# Patient Record
Sex: Male | Born: 1990 | Race: Black or African American | Hispanic: No | Marital: Single | Smoking: Current some day smoker
Health system: Southern US, Community
[De-identification: ages and names within clinical notes are randomized; demographics above are authoritative.]

## PROBLEM LIST (undated history)

## (undated) ENCOUNTER — Emergency Department (HOSPITAL_COMMUNITY): Admission: EM | Payer: Self-pay | Source: Home / Self Care

## (undated) ENCOUNTER — Ambulatory Visit (HOSPITAL_COMMUNITY): Admission: EM | Payer: Self-pay | Source: Home / Self Care

## (undated) ENCOUNTER — Emergency Department (HOSPITAL_COMMUNITY): Admission: EM | Discharge status: Absent without leave | Payer: Self-pay

## (undated) ENCOUNTER — Ambulatory Visit (HOSPITAL_COMMUNITY): Payer: Self-pay

## (undated) DIAGNOSIS — J45909 Unspecified asthma, uncomplicated: Secondary | ICD-10-CM

## (undated) DIAGNOSIS — W3400XA Accidental discharge from unspecified firearms or gun, initial encounter: Secondary | ICD-10-CM

## (undated) DIAGNOSIS — F259 Schizoaffective disorder, unspecified: Secondary | ICD-10-CM

---
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
## 2010-01-22 ENCOUNTER — Ambulatory Visit: Payer: Self-pay | Admitting: Family Medicine

## 2010-01-22 ENCOUNTER — Encounter: Payer: Self-pay | Admitting: Family Medicine

## 2010-01-22 DIAGNOSIS — N6459 Other signs and symptoms in breast: Secondary | ICD-10-CM

## 2010-01-22 DIAGNOSIS — F319 Bipolar disorder, unspecified: Secondary | ICD-10-CM | POA: Insufficient documentation

## 2010-01-22 LAB — CONVERTED CEMR LAB
AST: 22 units/L (ref 0–37)
Alkaline Phosphatase: 95 units/L (ref 39–117)
BUN: 11 mg/dL (ref 6–23)
Creatinine, Ser: 0.91 mg/dL (ref 0.40–1.50)
HCT: 38.8 % — ABNORMAL LOW (ref 39.0–52.0)
Hemoglobin: 12.6 g/dL — ABNORMAL LOW (ref 13.0–17.0)
MCHC: 32.5 g/dL (ref 30.0–36.0)
MCV: 68.4 fL — ABNORMAL LOW (ref 78.0–100.0)
RDW: 16 % — ABNORMAL HIGH (ref 11.5–15.5)

## 2010-02-02 ENCOUNTER — Telehealth: Payer: Self-pay | Admitting: *Deleted

## 2010-02-02 ENCOUNTER — Encounter: Payer: Self-pay | Admitting: *Deleted

## 2010-02-03 ENCOUNTER — Encounter (INDEPENDENT_AMBULATORY_CARE_PROVIDER_SITE_OTHER): Payer: Self-pay | Admitting: *Deleted

## 2010-03-31 ENCOUNTER — Encounter: Payer: Self-pay | Admitting: Family Medicine

## 2010-03-31 ENCOUNTER — Ambulatory Visit: Admission: RE | Admit: 2010-03-31 | Discharge: 2010-03-31 | Payer: Self-pay | Source: Home / Self Care

## 2010-03-31 DIAGNOSIS — L738 Other specified follicular disorders: Secondary | ICD-10-CM | POA: Insufficient documentation

## 2010-03-31 LAB — CONVERTED CEMR LAB

## 2010-04-01 ENCOUNTER — Encounter: Payer: Self-pay | Admitting: Family Medicine

## 2010-04-09 NOTE — Letter (Signed)
Summary: Probation Letter  [facility]  [address]   [place], [place] [address]   Phone: [phone]  Fax: [phone]    06/04/2009  Adrian Mcgrath [address] [place], [place]  [address]  Dear [name],  With the goal of better serving all our patients the [facility] is following each patient's missed appointments.  You have missed at least 3 appointments with our practice.If you cannot keep your appointment, we expect you to call at least 24 hours before your appointment time.  Missing appointments prevents other patients from seeing [place] and makes it difficult to provide you with the best possible medical care.      1.   If you miss one more appointment, we will only give you limited medical services. This means we will not call in medication refills, complete a form, or make a referral for you except when you are here for a scheduled office visit.    2.   If you miss 2 or more appointments in the next year, we will dismiss you from our practice.    Our office staff can be reached at [phone] Monday through Friday from 8:30 a.m.-5:00 p.m. and will be glad to schedule your appointment as necessary.    Thank you.   [facility]  Appended Document: Probation Letter mail returned

## 2010-04-09 NOTE — Miscellaneous (Signed)
Summary: Asthma, persistent    Clinical Lists Changes  Problems: Removed problem of CONTACT WITH OR EXPOSURE TO VENEREAL DISEASES (ICD-V01.6) Changed problem from ASTHMA, UNSPECIFIED (ICD-493.90) to ASTHMA, PERSISTENT (ICD-493.90)

## 2010-04-09 NOTE — Progress Notes (Signed)
Summary: meds prob   Phone Note Call from Patient Call back at [phone]   Caller: Mom-Penny Summary of Call: went to [facility] Saturday for pink eye and has lost RX drops needs more called in.  Initial call taken by: De Nurse,  October 01, 2009 10:48 AM  Follow-up for Phone Call        told her we cannot refill this since none of our mds wrote it. asked her to call her pharmacy where they got it filled & ask them to contact the prescibing md. if this does not waork, call back for an appt here Follow-up by: Golden Circle RN,  October 01, 2009 10:54 AM

## 2010-04-09 NOTE — Assessment & Plan Note (Signed)
Summary: asthma/std testing/bmc   Vital Signs:  Patient profile:   20 year old male Height:      67 inches Weight:      133.2 pounds BMI:     20.94 Temp:     98.2 degrees F oral Pulse rate:   86 / minute BP sitting:   126 / 78  (left arm) Cuff size:   regular  Vitals Entered By: Jimmy Footman, CMA (March 31, 2010 8:42 AM) CC: STD test/ cpe Is Patient Diabetic? No Pain Assessment Patient in pain? no        Primary Care Provider:  Delbert Harness MD  CC:  STD test/ cpe.  History of Present Illness: 19 yo here for follow-up  Psych:  seeing [facility] for mental health.  Decerased dose of depakote, still on cgentin and haldol.   Wearing ankle monitor.  STD:  states not sexually active, using condoms when he was.  no penile discharge or dysuria.  Notes 1-2 days ago "bumps" that appeared right after he shaved.  First occurrence, mildly irritated.  Desires STD screening.  Asthma:  usuing qvar and albuterol daily- he was unclear about when to use albuterol.  NO dyspnea, wheeze, allergies, cough.    Habits & Providers  Alcohol-Tobacco-Diet     Tobacco Status: current  Current Medications (verified): 1)  Qvar 80 Mcg/act Aers (Beclomethasone Dipropionate) .... 2 Puffs Twice A Day- Use in Place of Advair 2)  Zyrtec Allergy 10 Mg  Tabs (Cetirizine Hcl) .Marland Kitchen.. 1 Once Daily Prn 3)  Ventolin Hfa 108 (90 Base) Mcg/act Aers (Albuterol Sulfate) .... As Needed Shortness of Breath 4)  Depakote 250 Mg Tbec (Divalproex Sodium) .... One Tablet Twice A Day 5)  Benztropine Mesylate 2 Mg Tabs (Benztropine Mesylate) .... Take One Tablet Twice Daily 6)  Haloperidol 10 Mg Tabs (Haloperidol) .... One Tablet Twice A Day PMH-FH-SH reviewed for relevance  Review of Systems General:  Denies chills, fever, loss of appetite, sleep disorder, and weight loss. Eyes:  Denies blurring. ENT:  Denies earache and sore throat. CV:  Denies chest pain or discomfort, fatigue, lightheadness, palpitations, and  swelling of feet. Resp:  Denies chest discomfort, cough, shortness of breath, and wheezing. GI:  Denies abdominal pain, constipation, diarrhea, nausea, and vomiting. GU:  Denies dysuria, genital sores, and hematuria. MS:  Denies joint pain and joint swelling. Psych:  Denies anxiety, depression, easily angered, and irritability. Endo:  Denies cold intolerance and excessive thirst. Allergy:  Denies itching eyes.  Physical Exam  General:  Well appearing adolescent,no acute distress.  Thin adolescent with multiple tattoos across arms, neck and back. Eyes:  No corneal or conjunctival inflammation noted. EOMI. Perrla. Ears:  External ear exam shows no significant lesions or deformities.  Otoscopic examination reveals clear canals, tympanic membranes are intact bilaterally without bulging, retraction, inflammation or discharge. Hearing is grossly normal bilaterally. Nose:  External nasal examination shows no deformity or inflammation. Nasal mucosa are pink and moist without lesions or exudates. Mouth:  Oral mucosa and oropharynx without lesions or exudates.  Teeth in good repair. Neck:  supple, no masses Lungs:  Normal respiratory effort, chest expands symmetrically. Lungs are clear to auscultation, no crackles or wheezes. Heart:  Normal rate and regular rhythm. S1 and S2 normal without gallop, murmur, click, rub or other extra sounds. Genitalia:  circumcised.  small non erythematous papule over  hair follicle located on right around base of penis.   3 small similar papules located on base of  penis in area patient states he shaved.  no lymphadenopathy in groin.   Impression & Recommendations:  Problem # 1:  FOLLICULITIS (ICD-704.8)  Will check GC/Chalmydia, HIV, RPR today.  Papule on base consistant with follicultis.  Other area of papules appears similar and does not appear consisitant with HSV, chancroid, LGV.  no ulcer or vesicle to take sample.  Advised to discontinue shaving and if recurs,  come back for further evaluation.  Orders: [facility]- Est  Level 4 (98119)  Problem # 2:  ASTHMA, PERSISTENT (ICD-493.90)  Reveiwed proper use of controller vs emergency inhaler.  His updated medication list for this problem includes:    Qvar 80 Mcg/act Aers (Beclomethasone dipropionate) .Marland Kitchen... 2 puffs twice a day- use in place of advair    Ventolin Hfa 108 (90 Base) Mcg/act Aers (Albuterol sulfate) .Marland Kitchen... As needed shortness of breath  Orders: [facility]- Est  Level 4 (99214)  Problem # 3:  BIPOLAR DISORDER UNSPECIFIED (ICD-296.80) Now under care of [facility].  Med Rec completed.    Problem # 4:  CONTACT WITH OR EXPOSURE TO VENEREAL DISEASES (ICD-V01.6) See problem #1  Orders: GC/Chlamydia-[facility] (87591/87491) HIV-[facility] (14782-95621) RPR-[facility] (30865-78469) [facility]- Est  Level 4 (62952)  Complete Medication List: 1)  Qvar 80 Mcg/act Aers (Beclomethasone dipropionate) .... 2 puffs twice a day- use in place of advair 2)  Zyrtec Allergy 10 Mg Tabs (Cetirizine hcl) .Marland Kitchen.. 1 once daily prn 3)  Ventolin Hfa 108 (90 Base) Mcg/act Aers (Albuterol sulfate) .... As needed shortness of breath 4)  Depakote 250 Mg Tbec (Divalproex sodium) .... One tablet twice a day 5)  Benztropine Mesylate 2 Mg Tabs (Benztropine mesylate) .... Take one tablet twice daily 6)  Haloperidol 10 Mg Tabs (Haloperidol) .... One tablet twice a day  Patient Instructions: 1)  Keep looking for a job and stay in school!  Im glad you're doing better 2)  Only use your ventolin when you feel short of breath.  If you have to use it regularly, please make appointment to see me. 3)  Follow-up in 1 year or sooner if needed. 4)  I will send you a letter if you test results are normal.   Orders Added: 1)  GC/Chlamydia-[facility] [87591/87491] 2)  HIV-[facility] [84132-44010] 3)  RPR-[facility] [27253-66440] 4)  [facility]- Est  Level 4 [34742]

## 2010-04-09 NOTE — Progress Notes (Signed)
   Phone Note Outgoing Call   Call placed by: Jimmy Footman, CMA,  February 02, 2010 10:12 AM Call placed to: [facility] Summary of Call: Spoke with [place] @ the [facility] to ask about faxing a referral over to them for this pt. She told me that they do not accept the referrals and that the pt. is to make his own appt. Tried to contact pt. but phone # was temp. out of service. A letter was sent to the pt. and also for the pt. to call office when appt. is made

## 2010-04-09 NOTE — Miscellaneous (Signed)
Summary: fell off skateboard   Clinical Lists Changes fell off skateboard & hurt his wrist & side. states it is bleeding, not spurting. she is taking him to ED. told her I agreed.Golden Circle RN  September 03, 2009 11:27 AM

## 2010-04-09 NOTE — Assessment & Plan Note (Signed)
Summary: asthma, psych meds   Vital Signs:  Patient profile:   20 year old male Height:      67 inches Weight:      139.6 pounds BMI:     21.94 Temp:     98.1 degrees F oral Pulse rate:   80 / minute BP sitting:   102 / 64  (left arm) Cuff size:   regular  Vitals Entered By: Garen Grams LPN (January 22, 2010 11:05 AM) CC: knot on chest and refill meds Is Patient Diabetic? No Pain Assessment Patient in pain? no        Primary Care Provider:  Delbert Harness MD  CC:  knot on chest and refill meds.  History of Present Illness: 20 yo here to discuss refill meds and left breast lump  breast lump:  noticed lump behind left nipple for 2 weeks.  Slightly tender.   no skin changes or nipple discharge.    refill meds:  was incarcerated and was sent to [facility] inpatient psych for evaluation.  He brings depakote, haldol, and cogentin for refill.    asthma:  has been out of all meds.  has nocturnal cough, nonproductive nearly nightly.  Denies dyspnea.  has had some rhinorrhea but no fever, chills, sore throat, n/v/d  Habits & Providers  Alcohol-Tobacco-Diet     Tobacco Status: current     Tobacco Counseling: to quit use of tobacco products     Cigarette Packs/Day: <0.25  Current Medications (verified): 1)  Advair Diskus 100-50 Mcg/dose Misc (Fluticasone-Salmeterol) .Marland Kitchen.. 1 Puff 2 Times Daily 2)  Zyrtec Allergy 10 Mg  Tabs (Cetirizine Hcl) .Marland Kitchen.. 1 Once Daily Prn 3)  Ventolin Hfa 108 (90 Base) Mcg/act Aers (Albuterol Sulfate) .... As Needed Shortness of Breath 4)  Depakote 500 Mg Tbec (Divalproex Sodium) .... One Tablet Twice Daily 5)  Benztropine Mesylate 2 Mg Tabs (Benztropine Mesylate) .... Take One Tablet Twice Daily 6)  Haldol Decanoate 100 Mg/ml Soln (Haloperidol Decanoate) .... 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  Asthma Seasonal Allergies.  Past Surgical History: none  Social History: Lives with Mom and 5 siblings.  repeated legal troubles- most recent incarceration 12/2009 with subsequent inpatient psych eval.  Siblings Lula Olszewski, Heavan Denham, Tyrel Denham, Micah Flesher WilliamsonSmoking Status:  current Packs/Day:  <0.25  Review of Systems      See HPI  Physical Exam  General:  Well appearing adolescent,no acute distress.  Thin adolescent with multiple tattoos across arms and back. Breasts:  left breast with small breast bud.  Right wnl Lungs:  scattered expiratory wheezes throughout.  Good air movement,  normal work of breathing. Heart:  RRR without murmur    Impression & Recommendations:  Problem # 1:  ASTHMA, PERSISTENT (ICD-493.90)  Some wheezes today.  Advised resume use of advair.  given red flag for return.  His updated medication list for this problem includes:    Advair Diskus 100-50 Mcg/dose Misc (Fluticasone-salmeterol) .Marland Kitchen... 1 puff 2 times daily    Ventolin Hfa 108 (90 Base) Mcg/act Aers (Albuterol sulfate) .Marland Kitchen... As needed shortness of breath  Orders: [facility]- Est  Level 4 (04540)  Problem # 2:  BIPOLAR DISORDER UNSPECIFIED (ICD-296.80) per patient he has diagnosis of bipolar disorder.  Agreed to refill meds for one month.  Will request medical records from inpatient hospitalization for  further details.  Will refer to mental health for psychiatry care.  Monitoring labs as below.  Orders: Comp Met-[facility] [phone]) CBC-[facility] (09811) TSH-[facility] [phone]) Valproic Acid-[facility] (13086-57846) [facility]- Est  Level 4 (96295)  Problem # 3:  OTHER SIGN AND SYMPTOM IN BREAST (ICD-611.79)  normal pubertal variant.  Advised monitoring at this time.  Orders: [facility]- Est  Level 4 (99214)  Complete Medication List: 1)  Advair Diskus 100-50 Mcg/dose Misc (Fluticasone-salmeterol) .Marland Kitchen.. 1 puff 2 times daily 2)  Zyrtec Allergy 10 Mg  Tabs (Cetirizine hcl) .Marland Kitchen.. 1 once daily prn 3)  Ventolin Hfa 108 (90 Base) Mcg/act Aers (Albuterol sulfate) .... As needed shortness of breath 4)  Depakote 500 Mg Tbec (Divalproex sodium) .... One tablet twice daily 5)  Benztropine Mesylate 2 Mg Tabs (Benztropine mesylate) .... Take one tablet twice daily 6)  Haloperidol 10 Mg Tabs (Haloperidol) .... One tablet twice a day  Patient Instructions: 1)  I think your knot under your left breast is normal and will go away in time. if it gets larger, p[lease return for re-eval.  2)  Will chekc labwork today 3)  will get records from [place] 4)  Wil refer you to a psychiatrist- if you do not hear about an appt from [place] in 1-2 weeks, give [place] a call Prescriptions: HALOPERIDOL 10 MG TABS (HALOPERIDOL) one tablet twice a day  #60 x 0   Entered and Authorized by:   Delbert Harness MD   Signed by:   Delbert Harness MD on 01/22/2010   Method used:   [facility] to        [facility] [phone]* (retail)       [address] [place]  [address]       Ph: [phone] or [phone]       Fax: [phone]   RxID:   [id number] VENTOLIN HFA 108 (90 BASE) MCG/ACT AERS (ALBUTEROL SULFATE) as needed shortness of breath  #1 x 2   Entered and Authorized by:   Delbert Harness MD   Signed by:   Delbert Harness MD on 01/22/2010   Method used:   [facility] to        [facility] [phone]* (retail)       [address], [place]  [address]       Ph: [phone] or [phone]       Fax: [phone]   RxID:   [id number] ZYRTEC ALLERGY 10 MG  TABS (CETIRIZINE HCL) 1 once daily prn  #30 x 5   Entered and Authorized by:   Delbert Harness MD   Signed by:   Delbert Harness MD on 01/22/2010   Method used:   [facility]ronically to        [facility] [phone]* (retail)       [address] [place]  [address]       Ph: [phone] or [phone]       Fax: [phone]   RxID:   [id number] ADVAIR DISKUS 100-50 MCG/DOSE MISC  (FLUTICASONE-SALMETEROL) 1 puff 2 times daily  #1 x 5   Entered and Authorized by:   Delbert Harness MD   Signed by:   Delbert Harness MD on 01/22/2010   Method used:   [facility]ronically to        [facility] [phone]* (retail)  [address], [place]  [address]       Ph: [phone] or [phone]       Fax: [phone]   RxID:   [id number] HALDOL DECANOATE 100 MG/ML SOLN (HALOPERIDOL DECANOATE) one tabet twice a day  #60 x 0   Entered and Authorized by:   Delbert Harness MD   Signed by:   Delbert Harness MD on 01/22/2010   Method used:   [facility]ronically to        [facility] [phone]* (retail)       [address], [place]  [address]       Ph: [phone] or [phone]       Fax: [phone]   RxID:   [id number] BENZTROPINE MESYLATE 2 MG TABS (BENZTROPINE MESYLATE) take one tablet twice daily  #60 x 0   Entered and Authorized by:   Delbert Harness MD   Signed by:   Delbert Harness MD on 01/22/2010   Method used:   [facility]ronically to        [facility] [phone]* (retail)       [address], [place]  [address]       Ph: [phone] or [phone]       Fax: [phone]   RxID:   [id number] DEPAKOTE 500 MG TBEC (DIVALPROEX SODIUM) one tablet twice daily  #60 x 0   Entered and Authorized by:   Delbert Harness MD   Signed by:   Delbert Harness MD on 01/22/2010   Method used:   [facility]ronically to        [facility] [phone]* (retail)       [address], [place]  [address]       Ph: [phone] or [phone]       Fax: [phone]   RxID:   [id number]    Orders Added: 1)  Comp Met-[facility] [[id number]] 2)  CBC-[facility] [85027] 3)  TSH-[facility] [02409-73532] 4)  Valproic Acid-[facility] [80164-23520] 5)  [facility]- Est  Level 4 [99242]  Appended Document: psych referral     Clinical Lists Changes  Orders: Added new Referral order of Psychiatric Referral (Psych) - Signed      Appended Document: asthma, psych meds Medications  Added QVAR 80 MCG/ACT AERS (BECLOMETHASONE DIPROPIONATE) 2 puffs twice a day- Use in place of advair      Receievd prior authorization for advair.  Since patient has been completely of controller medications, will start back with flovent and escalate back to advair if needed. Delbert Harness MD  January 23, 2010 8:36 AM     Clinical Lists Changes  Medications: Changed medication from ADVAIR DISKUS 100-50 MCG/DOSE MISC (FLUTICASONE-SALMETEROL) 1 puff 2 times daily to QVAR 80 MCG/ACT AERS (BECLOMETHASONE DIPROPIONATE) 2 puffs twice a day- Use in place of advair - Signed Rx of QVAR 80 MCG/ACT AERS (BECLOMETHASONE DIPROPIONATE) 2 puffs twice a day- Use in place of advair;  #1 x 2;  Signed;  Entered by: Delbert Harness MD;  Authorized by: Delbert Harness MD;  Method used: [facility] [phone]*, [address], [place], [place]  [address], Ph: [phone] or [phone], Fax: [phone]    Prescriptions: QVAR 80 MCG/ACT AERS (BECLOMETHASONE DIPROPIONATE) 2 puffs twice a day- Use in place of advair  #1 x 2  Entered and Authorized by:   Delbert Harness MD   Signed by:   Delbert Harness MD on 01/23/2010   Method used:   [facility]ronically to        [facility] [phone]* (retail)       [address]       [place], [place]  [address]       Ph: [phone] or [phone]       Fax: [phone]   RxID:   [id number]

## 2010-04-09 NOTE — Letter (Signed)
Summary: ROI  ROI   Imported By: Marily Memos 02/05/2010 15:10:01  _____________________________________________________________________  External Attachment:    Type:   Image     Comment:   External Document

## 2010-04-09 NOTE — Miscellaneous (Signed)
Summary: Medical record request  Clinical Lists Changes  Rec'd medical record request to go to: [place] [facility] Date sent: 12/29/09 Marily Memos  February 03, 2010 12:16 PM

## 2010-04-09 NOTE — Letter (Signed)
Summary: Generic Letter  [facility]  [address]   [place], [place] [address]   Phone: [phone]  Fax: [phone]    02/02/2010  [facility] [address] [place], [place]  [address]  Dear Mr. WEXLER,   A psychological referral was put in for you from Dr. Earnest Bailey. The appointment is needing to be set up by you throught the [facility]. The number to call is [phone]. Please follow up with [place] when the appointment is made.      Sincerely,   Jimmy Footman, CMA

## 2010-04-09 NOTE — Letter (Signed)
Summary: Results Follow-up Letter  [facility]  [address]   [place], [place] [address]   Phone: [phone]  Fax: [phone]    04/01/2010  [address] [place], [place]  [address]  Dear Mr. NYLUND,   The following are the results of your recent test(s):  Your tests for gonorrhea, chlamydia, HIV, syphillis were all negative.    Sincerely,  Delbert Harness MD [facility]           Appended Document: Results Follow-up Letter mailed

## 2010-06-25 ENCOUNTER — Other Ambulatory Visit: Payer: Self-pay | Admitting: Family Medicine

## 2010-06-25 NOTE — Telephone Encounter (Signed)
Refill request

## 2010-07-02 ENCOUNTER — Telehealth: Payer: Self-pay | Admitting: *Deleted

## 2010-07-02 NOTE — Telephone Encounter (Signed)
Received call Dr. Warrick Parisian from [facility].  She states  she is  Wellsite geologist for [employer] .  Because  patient has medicaid the state requires she follow up with primary care MD  to make sure patient is compliant with meds and appointments and to make sure he is getting yearly physicals. Advised will send message to MD  to please advise and to make sure we can give this information  to her.Marland Kitchen

## 2010-07-06 NOTE — Telephone Encounter (Signed)
Called back to Dr. Creta Levin at [phone] ext [phone] and left a message for her to call back.Marland Kitchen

## 2010-07-06 NOTE — Telephone Encounter (Signed)
Message from  MD given to Dr. Creta Levin and she would like for him to have a visit for a physical exam and she will advises him to call for appointment.

## 2010-07-06 NOTE — Telephone Encounter (Signed)
Patient seen 03/31/10 and 01/22/10.  No follow-up needed for 1 year from last office visit or prn.  Please discuss with office manager requirements for records request.

## 2010-11-30 LAB — POCT RAPID STREP A: Streptococcus, Group A Screen (Direct): NEGATIVE

## 2011-04-26 ENCOUNTER — Encounter: Payer: Self-pay | Admitting: Family Medicine

## 2013-07-28 ENCOUNTER — Emergency Department (HOSPITAL_COMMUNITY)
Admission: EM | Admit: 2013-07-28 | Discharge: 2013-07-28 | Disposition: A | Discharge status: Home / Self Care | Payer: Self-pay | Attending: Emergency Medicine | Admitting: Emergency Medicine

## 2013-07-28 ENCOUNTER — Emergency Department (HOSPITAL_COMMUNITY): Payer: Self-pay

## 2013-07-28 ENCOUNTER — Encounter (HOSPITAL_COMMUNITY): Payer: Self-pay | Admitting: Emergency Medicine

## 2013-07-28 DIAGNOSIS — W261XXA Contact with sword or dagger, initial encounter: Secondary | ICD-10-CM

## 2013-07-28 DIAGNOSIS — Y939 Activity, unspecified: Secondary | ICD-10-CM | POA: Insufficient documentation

## 2013-07-28 DIAGNOSIS — S91309A Unspecified open wound, unspecified foot, initial encounter: Secondary | ICD-10-CM | POA: Insufficient documentation

## 2013-07-28 DIAGNOSIS — W260XXA Contact with knife, initial encounter: Secondary | ICD-10-CM | POA: Insufficient documentation

## 2013-07-28 DIAGNOSIS — Y929 Unspecified place or not applicable: Secondary | ICD-10-CM | POA: Insufficient documentation

## 2013-07-28 DIAGNOSIS — T148XXA Other injury of unspecified body region, initial encounter: Secondary | ICD-10-CM

## 2013-07-28 NOTE — Discharge Instructions (Signed)
Follow up as needed for any sign of infection Puncture Wound A puncture wound happens when a sharp object pokes a hole in the skin. A puncture wound can cause an infection because germs can go under the skin during the injury. HOME CARE   Change your bandage (dressing) once a day, or as told by your doctor. If the bandage sticks, soak it in water.  Keep all doctor visits as told.  Only take medicine as told by your doctor.  Take your medicine (antibiotics) as told. Finish them even if you start to feel better. You may need a tetanus shot if:  You cannot remember when you had your last tetanus shot.  You have never had a tetanus shot. If you need a tetanus shot and you choose not to have one, you may get tetanus. Sickness from tetanus can be serious. You may need a rabies shot if an animal bite caused your wound. GET HELP RIGHT AWAY IF:   Your wound is red, puffy (swollen), or painful.  You see red lines on the skin near the wound.  You have a bad smell coming from the wound or bandage.  You have yellowish-white fluid (pus) coming from the wound.  Your medicine is not working.  You notice an object in the wound.  You have a fever.  You have severe pain.  You have trouble breathing.  You feel dizzy or pass out (faint).  You keep throwing up (vomiting).  You lose feeling (numbness) in your arm or leg, or you cannot move your arm or leg.  Your problems get worse. MAKE SURE YOU:   Understand these instructions.  Will watch your condition.  Will get help right away if you are not doing well or get worse. Document Released: 12/02/2007 Document Revised: 05/17/2011 Document Reviewed: 08/11/2010 [facility] Patient Information 2014 [place], [place].

## 2013-07-28 NOTE — ED Notes (Signed)
He states he inadvertently contacted a knife lying on his floor yesterday evening.  He has sm. Puncture area on bottom of left proximal, lateral foot.  He is in no distress.  He also has abrased knuckles on right hand, which I cleanse with Zephiran.

## 2013-07-28 NOTE — ED Provider Notes (Signed)
Medical screening examination/treatment/procedure(s) were performed by non-physician practitioner and as supervising physician I was immediately available for consultation/collaboration.   EKG Interpretation None       Ethelda Chick, MD 07/28/13 1146
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
## 2013-09-14 ENCOUNTER — Encounter (HOSPITAL_COMMUNITY): Payer: Self-pay | Admitting: Emergency Medicine

## 2013-09-14 ENCOUNTER — Emergency Department (HOSPITAL_COMMUNITY): Payer: Self-pay

## 2013-09-14 ENCOUNTER — Observation Stay (HOSPITAL_COMMUNITY)
Admission: EM | Admit: 2013-09-14 | Discharge: 2013-09-15 | Disposition: A | Discharge status: Home / Self Care | Payer: Self-pay | Attending: Surgery | Admitting: Surgery

## 2013-09-14 DIAGNOSIS — S71009A Unspecified open wound, unspecified hip, initial encounter: Secondary | ICD-10-CM

## 2013-09-14 DIAGNOSIS — S71109A Unspecified open wound, unspecified thigh, initial encounter: Secondary | ICD-10-CM

## 2013-09-14 DIAGNOSIS — Z23 Encounter for immunization: Secondary | ICD-10-CM | POA: Insufficient documentation

## 2013-09-14 DIAGNOSIS — F17200 Nicotine dependence, unspecified, uncomplicated: Secondary | ICD-10-CM | POA: Insufficient documentation

## 2013-09-14 DIAGNOSIS — J45909 Unspecified asthma, uncomplicated: Secondary | ICD-10-CM | POA: Insufficient documentation

## 2013-09-14 DIAGNOSIS — F913 Oppositional defiant disorder: Secondary | ICD-10-CM | POA: Insufficient documentation

## 2013-09-14 DIAGNOSIS — W3400XA Accidental discharge from unspecified firearms or gun, initial encounter: Secondary | ICD-10-CM

## 2013-09-14 DIAGNOSIS — Y9289 Other specified places as the place of occurrence of the external cause: Secondary | ICD-10-CM | POA: Insufficient documentation

## 2013-09-14 DIAGNOSIS — F319 Bipolar disorder, unspecified: Secondary | ICD-10-CM | POA: Insufficient documentation

## 2013-09-14 LAB — PREPARE FRESH FROZEN PLASMA
UNIT DIVISION: 0
Unit division: 0

## 2013-09-14 LAB — CBC
HCT: 39.6 % (ref 39.0–52.0)
HEMATOCRIT: 40.7 % (ref 39.0–52.0)
HEMOGLOBIN: 13.2 g/dL (ref 13.0–17.0)
HEMOGLOBIN: 13.1 g/dL (ref 13.0–17.0)
MCH: 21.7 pg — ABNORMAL LOW (ref 26.0–34.0)
MCH: 22 pg — ABNORMAL LOW (ref 26.0–34.0)
MCHC: 32.4 g/dL (ref 30.0–36.0)
MCHC: 33.1 g/dL (ref 30.0–36.0)
MCV: 66.8 fL — ABNORMAL LOW (ref 78.0–100.0)
MCV: 66.4 fL — ABNORMAL LOW (ref 78.0–100.0)
Platelets: 232 10*3/uL (ref 150–400)
Platelets: 215 10*3/uL (ref 150–400)
RBC: 6.09 MIL/uL — ABNORMAL HIGH (ref 4.22–5.81)
RBC: 5.96 MIL/uL — ABNORMAL HIGH (ref 4.22–5.81)
RDW: 15.4 % (ref 11.5–15.5)
RDW: 15.4 % (ref 11.5–15.5)
WBC: 9.9 10*3/uL (ref 4.0–10.5)
WBC: 11.8 10*3/uL — ABNORMAL HIGH (ref 4.0–10.5)

## 2013-09-14 LAB — BASIC METABOLIC PANEL
Anion gap: 18 — ABNORMAL HIGH (ref 5–15)
BUN: 21 mg/dL (ref 6–23)
CHLORIDE: 99 meq/L (ref 96–112)
CO2: 22 mEq/L (ref 19–32)
Calcium: 9.6 mg/dL (ref 8.4–10.5)
Creatinine, Ser: 1.2 mg/dL (ref 0.50–1.35)
GFR, EST NON AFRICAN AMERICAN: 84 mL/min — AB (ref >90)
Glucose, Bld: 110 mg/dL — ABNORMAL HIGH (ref 70–99)
POTASSIUM: 3.6 meq/L — AB (ref 3.7–5.3)
SODIUM: 139 meq/L (ref 137–147)

## 2013-09-14 LAB — TYPE AND SCREEN
ABO/RH(D): B POS
Antibody Screen: NEGATIVE
UNIT DIVISION: 0
Unit division: 0

## 2013-09-14 LAB — ABO/RH: ABO/RH(D): B POS

## 2013-09-14 MED ORDER — ONDANSETRON HCL 4 MG PO TABS: 4.0000 mg | ORAL_TABLET | Freq: Four times a day (QID) | ORAL | Status: DC | PRN

## 2013-09-14 MED ORDER — PANTOPRAZOLE SODIUM 40 MG IV SOLR
40.0000 mg | Freq: Every day | INTRAVENOUS | Status: DC
  Filled 2013-09-14 (×2): qty 40

## 2013-09-14 MED ORDER — ACETAMINOPHEN 325 MG PO TABS: 650.0000 mg | ORAL_TABLET | ORAL | Status: DC | PRN

## 2013-09-14 MED ORDER — OXYCODONE HCL 5 MG PO TABS
5.0000 mg | ORAL_TABLET | ORAL | Status: DC | PRN
  Administered 2013-09-15: 5 mg via ORAL
  Filled 2013-09-14: qty 1

## 2013-09-14 MED ORDER — HYDROMORPHONE HCL PF 1 MG/ML IJ SOLN
1.0000 mg | Freq: Once | INTRAMUSCULAR | Status: AC
  Administered 2013-09-14: 1 mg via INTRAVENOUS

## 2013-09-14 MED ORDER — ENOXAPARIN SODIUM 40 MG/0.4ML ~~LOC~~ SOLN
40.0000 mg | SUBCUTANEOUS | Status: DC
  Administered 2013-09-15: 40 mg via SUBCUTANEOUS
  Filled 2013-09-14: qty 0.4

## 2013-09-14 MED ORDER — ALBUTEROL SULFATE HFA 108 (90 BASE) MCG/ACT IN AERS: 1.0000 | INHALATION_SPRAY | Freq: Four times a day (QID) | RESPIRATORY_TRACT | Status: DC | PRN

## 2013-09-14 MED ORDER — OXYCODONE HCL 5 MG PO TABS
10.0000 mg | ORAL_TABLET | ORAL | Status: DC | PRN
  Administered 2013-09-14 – 2013-09-15 (×3): 10 mg via ORAL
  Filled 2013-09-14 (×3): qty 2

## 2013-09-14 MED ORDER — HYDROMORPHONE HCL PF 1 MG/ML IJ SOLN
1.0000 mg | INTRAMUSCULAR | Status: DC | PRN
  Administered 2013-09-14: 1 mg via INTRAVENOUS
  Filled 2013-09-14: qty 1

## 2013-09-14 MED ORDER — ONDANSETRON HCL 4 MG/2ML IJ SOLN: 4.0000 mg | Freq: Four times a day (QID) | INTRAMUSCULAR | Status: DC | PRN

## 2013-09-14 MED ORDER — PANTOPRAZOLE SODIUM 40 MG PO TBEC
40.0000 mg | DELAYED_RELEASE_TABLET | Freq: Every day | ORAL | Status: DC
  Filled 2013-09-14 (×2): qty 1

## 2013-09-14 MED ORDER — KCL IN DEXTROSE-NACL 20-5-0.45 MEQ/L-%-% IV SOLN
INTRAVENOUS | Status: DC
  Administered 2013-09-15: via INTRAVENOUS
  Filled 2013-09-14 (×2): qty 1000

## 2013-09-14 MED ORDER — HYDROMORPHONE HCL PF 1 MG/ML IJ SOLN
INTRAMUSCULAR | Status: AC
  Administered 2013-09-14: 1 mg via INTRAVENOUS
  Filled 2013-09-14: qty 1

## 2013-09-14 MED ORDER — SODIUM CHLORIDE 0.9 % IV SOLN: Freq: Once | INTRAVENOUS | Status: DC

## 2013-09-14 MED ORDER — CLINDAMYCIN PHOSPHATE 600 MG/50ML IV SOLN
600.0000 mg | Freq: Once | INTRAVENOUS | Status: AC
  Administered 2013-09-15: 600 mg via INTRAVENOUS
  Filled 2013-09-14: qty 50

## 2013-09-14 MED ORDER — SODIUM CHLORIDE 0.9 % IV SOLN
Freq: Once | INTRAVENOUS | Status: AC
  Administered 2013-09-14: 18:00:00 via INTRAVENOUS

## 2013-09-14 MED ORDER — TETANUS-DIPHTH-ACELL PERTUSSIS 5-2.5-18.5 LF-MCG/0.5 IM SUSP
0.5000 mL | Freq: Once | INTRAMUSCULAR | Status: AC
  Administered 2013-09-14: 0.5 mL via INTRAMUSCULAR
  Filled 2013-09-14: qty 0.5
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
## 2013-09-14 NOTE — ED Notes (Signed)
Family at bedside. 
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
## 2013-09-14 NOTE — ED Notes (Signed)
attempted report 

## 2013-09-14 NOTE — ED Notes (Signed)
Bilateral ankle pressure checked by Dr. Janee Mornhompson.

## 2013-09-15 MED ORDER — OXYCODONE HCL 10 MG PO TABS: 5.0000 mg | ORAL_TABLET | Freq: Four times a day (QID) | ORAL | Status: DC | PRN

## 2013-09-15 MED ORDER — IBUPROFEN 600 MG PO TABS: 600.0000 mg | ORAL_TABLET | Freq: Four times a day (QID) | ORAL | Status: DC | PRN

## 2013-09-15 MED ORDER — ACETAMINOPHEN 325 MG PO TABS: 650.0000 mg | ORAL_TABLET | ORAL | Status: DC | PRN

## 2013-09-15 NOTE — Discharge Summary (Signed)
I have seen and examined the patient and agree with the assessment and plans.  Marsha Gundlach A. Hosie Sharman  MD, FACS  
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
## 2013-09-15 NOTE — Progress Notes (Signed)
Pt discharged home; pt vs stable at time of discharge. Pt verbalized understanding discharge instructions and follow-up appts. Pt left via wheelchair and denied any complaints or concerns at the time of discharge.

## 2013-09-15 NOTE — Care Management Utilization Note (Signed)
UR completed.    Zareah Hunzeker Wise Maresha Anastos, RN, BSN Phone #[phone]  

## 2013-09-15 NOTE — Progress Notes (Signed)
Orthopedic Tech Progress Note Patient Details:  [facility] April 29, 1990 [id number]  Ortho Devices Type of Ortho Device: Crutches Ortho Device/Splint Interventions: Application   Shawnie PonsCammer, [facility] 09/15/2013, 3:58 PM
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
## 2013-09-19 ENCOUNTER — Encounter: Payer: Self-pay | Admitting: Internal Medicine

## 2013-09-19 ENCOUNTER — Ambulatory Visit: Payer: Self-pay | Attending: Internal Medicine | Admitting: Internal Medicine

## 2013-09-19 VITALS — BP 95/58 | HR 83 | Temp 98.1°F | Resp 16 | Ht 67.0 in | Wt 152.0 lb

## 2013-09-19 DIAGNOSIS — S71109A Unspecified open wound, unspecified thigh, initial encounter: Principal | ICD-10-CM | POA: Insufficient documentation

## 2013-09-19 DIAGNOSIS — F17200 Nicotine dependence, unspecified, uncomplicated: Secondary | ICD-10-CM | POA: Insufficient documentation

## 2013-09-19 DIAGNOSIS — W3400XA Accidental discharge from unspecified firearms or gun, initial encounter: Secondary | ICD-10-CM | POA: Insufficient documentation

## 2013-09-19 DIAGNOSIS — S71009A Unspecified open wound, unspecified hip, initial encounter: Secondary | ICD-10-CM | POA: Insufficient documentation

## 2013-09-19 DIAGNOSIS — T148XXA Other injury of unspecified body region, initial encounter: Secondary | ICD-10-CM

## 2013-09-19 MED ORDER — OXYCODONE HCL 10 MG PO TABS: 5.0000 mg | ORAL_TABLET | Freq: Four times a day (QID) | ORAL | Status: DC | PRN
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
## 2013-09-24 ENCOUNTER — Encounter (HOSPITAL_COMMUNITY): Payer: Self-pay | Admitting: Emergency Medicine

## 2013-09-24 ENCOUNTER — Emergency Department (HOSPITAL_COMMUNITY)
Admission: EM | Admit: 2013-09-24 | Discharge: 2013-09-24 | Disposition: A | Discharge status: Home / Self Care | Payer: Self-pay | Attending: Emergency Medicine | Admitting: Emergency Medicine

## 2013-09-24 DIAGNOSIS — Z0289 Encounter for other administrative examinations: Secondary | ICD-10-CM | POA: Insufficient documentation

## 2013-09-24 DIAGNOSIS — F17200 Nicotine dependence, unspecified, uncomplicated: Secondary | ICD-10-CM | POA: Insufficient documentation

## 2013-09-24 DIAGNOSIS — Z0000 Encounter for general adult medical examination without abnormal findings: Secondary | ICD-10-CM

## 2013-09-24 DIAGNOSIS — Z87828 Personal history of other (healed) physical injury and trauma: Secondary | ICD-10-CM | POA: Insufficient documentation
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
## 2013-09-24 NOTE — Discharge Planning (Signed)
[facility] Community Liaison   Spoke to patient regarding primary care resources and establishing care with a provider. Patient states that he has been seen at [facility] several times for follow up. Patient verbalized interest in applying for medicaid and asked could he apply here in the ED. Pt states he was told while being admitted that he could qualify for medicaid services. This liaison informed patient on the process of applying for medicaid. Pt states he is in need of a note to return to work. This liaison notified PA-C S.Upstill  about the documentation that the patient is requesting. Orange card application and instructions provided, medicaid application also given. Resource guide and my contact information provided for any future questions or concerns. No other Community Liaison needs identified at this time.
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
## 2013-09-24 NOTE — Discharge Instructions (Signed)
Normal Exam, Adult  You were seen and examined today in our facility. Our caregiver found nothing wrong on the exam. If testing was done such as lab work or x-rays, they did not indicate enough wrong to suggest that treatment should be given. The caregiver then must decide after testing is finished if your concern is a physical problem or illness that needs treatment. Today no treatable problem was found. Even if reassurance was given, if you feel you are getting worse when you get home make sure you call back or return to our department.  For the protection of your privacy, test results can not be given over the phone. Make sure you receive the results of your test. Ask as to how these results are to be obtained if you have not been informed. It is your responsibility to obtain your test results.  Your condition can change over time. Sometimes it takes more than one visit to determine the cause of your symptoms. It is important that you monitor your condition for any changes.  SEEK IMMEDIATE MEDICAL CARE IF:  · You develop an oral temperature above 102° F (38.9° C), which lasts for more than 2 days, not controlled by medications. Only take over-the-counter or prescription medicines for pain, discomfort, or fever as directed by your caregiver.  · You develop a loss of appetite or start throwing up (vomiting).  · You develop a rash, cough, belly (abdominal) pain, earache, headache, or develop pain in neck, muscles, or joints.  · The problem or problems which brought you to our facility become worse or are a cause of more concern.  If we have told you today your exam and tests are normal, and a short while later you feel this is not right, please seek medical attention so you may be rechecked.  Document Released: 06/06/2000 Document Revised: 05/17/2011 Document Reviewed: 09/29/2007  ExitCare® Patient Information ©2015 [employer]. This information is not intended to replace advice given to you by your health care  provider. Make sure you discuss any questions you have with your health care provider.
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
## 2013-09-26 NOTE — Progress Notes (Signed)
Telephone Encounter 09/26/2013   Patient called regarding medication refill, pt requesting assistance. Honor JunesDora G. from case management services also spoke with this liaison about the patient, and a telephone encounter she had with the pt as well. I passed along the information given to me via the patient to the ED case manager Salvadore DomAndrea S. to follow up.

## 2013-09-26 NOTE — Progress Notes (Signed)
  CARE MANAGEMENT ED NOTE 09/26/2013  Patient:  Adrian Mcgrath,Adrian Mcgrath   Account Number:  [id number]  Date Initiated:  09/26/2013  Documentation initiated by:  Ferdinand CavaSCHETTINO,Karis Rilling  Subjective/Objective Assessment:   23 yo male called requesting assistance with medications     Subjective/Objective Assessment Detail:     Action/Plan:   [facility] care and medication resources   Action/Plan Detail:   Anticipated DC Date:       Status Recommendation to Physician:   Result of Recommendation:  Agreed    DC Planning Services  Other    Choice offered to / List presented to:            Status of service:  Completed, signed off  ED Comments:   ED Comments Detail:  Buddy DutyFelicia Evans, [facility] liaison, informed this RN that she received a phone call from the patient requesting assistance for medications. This CM called and spoke with the patient. Patient stated that he received a letter from the hospital to help him with his medications to lower the copay to $3 but when he took it to the pharmacy a second time they informed him that it is only good one time. This CM reviewed with the patient that [facility] was provided to the patient upon discharge from the hospital on 09/15/2013 and that is when he first used [facility]. The patient confirmed that this was correct. He stated that since this time he has received another narcotic prescription and requesting assistance with affordability. This CM explained to the patient that the [facility] can only be used once time in a year and that there is no other assistance for narcotics. Encouraged the patient coordinate [facility] for resources for medications and follow up care since he is an established patient. Patient verbalized understanding and had no other questions or concerns.

## 2013-09-27 NOTE — ED Provider Notes (Signed)
Medical screening examination/treatment/procedure(s) were performed by non-physician practitioner and as supervising physician I was immediately available for consultation/collaboration.   Jayse Hodkinson E Mitchelle Sultan, MD 09/27/13 0723 

## 2013-11-24 ENCOUNTER — Encounter (HOSPITAL_COMMUNITY): Payer: Self-pay | Admitting: Emergency Medicine

## 2013-11-24 ENCOUNTER — Emergency Department (HOSPITAL_COMMUNITY)
Admission: EM | Admit: 2013-11-24 | Discharge: 2013-11-24 | Discharge status: Against Medical Advice | Payer: Self-pay | Attending: Emergency Medicine | Admitting: Emergency Medicine

## 2013-11-24 DIAGNOSIS — F1210 Cannabis abuse, uncomplicated: Secondary | ICD-10-CM | POA: Insufficient documentation

## 2013-11-24 DIAGNOSIS — F17200 Nicotine dependence, unspecified, uncomplicated: Secondary | ICD-10-CM | POA: Insufficient documentation

## 2013-11-24 DIAGNOSIS — F309 Manic episode, unspecified: Secondary | ICD-10-CM | POA: Insufficient documentation

## 2013-11-24 DIAGNOSIS — Z87828 Personal history of other (healed) physical injury and trauma: Secondary | ICD-10-CM | POA: Insufficient documentation

## 2013-11-24 DIAGNOSIS — F22 Delusional disorders: Secondary | ICD-10-CM | POA: Insufficient documentation

## 2013-11-24 DIAGNOSIS — F29 Unspecified psychosis not due to a substance or known physiological condition: Secondary | ICD-10-CM | POA: Insufficient documentation

## 2013-11-24 DIAGNOSIS — IMO0002 Reserved for concepts with insufficient information to code with codable children: Secondary | ICD-10-CM | POA: Insufficient documentation

## 2013-11-24 DIAGNOSIS — Z79899 Other long term (current) drug therapy: Secondary | ICD-10-CM | POA: Insufficient documentation

## 2013-11-24 HISTORY — DX: Schizoaffective disorder, unspecified: F25.9

## 2013-11-24 HISTORY — DX: Accidental discharge from unspecified firearms or gun, initial encounter: W34.00XA

## 2013-11-24 LAB — COMPREHENSIVE METABOLIC PANEL
ALT: 10 U/L (ref 0–53)
AST: 22 U/L (ref 0–37)
Albumin: 4.3 g/dL (ref 3.5–5.2)
Alkaline Phosphatase: 93 U/L (ref 39–117)
Anion gap: 15 (ref 5–15)
BUN: 11 mg/dL (ref 6–23)
CO2: 26 meq/L (ref 19–32)
Calcium: 9.6 mg/dL (ref 8.4–10.5)
Chloride: 97 mEq/L (ref 96–112)
Creatinine, Ser: 1.2 mg/dL (ref 0.50–1.35)
GFR, EST NON AFRICAN AMERICAN: 84 mL/min — AB (ref >90)
GLUCOSE: 109 mg/dL — AB (ref 70–99)
Potassium: 3.4 mEq/L — ABNORMAL LOW (ref 3.7–5.3)
SODIUM: 138 meq/L (ref 137–147)
TOTAL PROTEIN: 8.4 g/dL — AB (ref 6.0–8.3)
Total Bilirubin: 0.6 mg/dL (ref 0.3–1.2)

## 2013-11-24 LAB — CBC
HCT: 41.3 % (ref 39.0–52.0)
HEMOGLOBIN: 13.9 g/dL (ref 13.0–17.0)
MCH: 22.2 pg — ABNORMAL LOW (ref 26.0–34.0)
MCHC: 33.7 g/dL (ref 30.0–36.0)
MCV: 66.1 fL — ABNORMAL LOW (ref 78.0–100.0)
Platelets: 245 10*3/uL (ref 150–400)
RBC: 6.25 MIL/uL — AB (ref 4.22–5.81)
RDW: 15.1 % (ref 11.5–15.5)
WBC: 11.6 10*3/uL — ABNORMAL HIGH (ref 4.0–10.5)

## 2013-11-24 LAB — RAPID URINE DRUG SCREEN, HOSP PERFORMED
AMPHETAMINES: NOT DETECTED
Barbiturates: NOT DETECTED
Benzodiazepines: NOT DETECTED
Cocaine: NOT DETECTED
OPIATES: NOT DETECTED
TETRAHYDROCANNABINOL: POSITIVE — AB

## 2013-11-24 LAB — SALICYLATE LEVEL: Salicylate Lvl: <2 mg/dL — ABNORMAL LOW (ref 2.8–20.0)

## 2013-11-24 LAB — ETHANOL: Alcohol, Ethyl (B): <11 mg/dL (ref 0–11)

## 2013-11-24 LAB — ACETAMINOPHEN LEVEL

## 2013-11-24 MED ORDER — LORAZEPAM 1 MG PO TABS
1.0000 mg | ORAL_TABLET | Freq: Once | ORAL | Status: DC
  Filled 2013-11-24: qty 1
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
## 2013-11-24 NOTE — ED Notes (Signed)
Dr Yelverton at bedside.  

## 2013-11-24 NOTE — BH Assessment (Signed)
Attempted to complete assessment and was informed by RN that pt left the ED.
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
## 2013-11-25 ENCOUNTER — Emergency Department (HOSPITAL_COMMUNITY)
Admission: EM | Admit: 2013-11-25 | Discharge: 2013-11-27 | Disposition: A | Discharge status: Home / Self Care | Payer: Self-pay | Attending: Emergency Medicine | Admitting: Emergency Medicine

## 2013-11-25 ENCOUNTER — Encounter (HOSPITAL_COMMUNITY): Payer: Self-pay | Admitting: Emergency Medicine

## 2013-11-25 DIAGNOSIS — R451 Restlessness and agitation: Secondary | ICD-10-CM | POA: Diagnosis present

## 2013-11-25 DIAGNOSIS — F259 Schizoaffective disorder, unspecified: Secondary | ICD-10-CM

## 2013-11-25 DIAGNOSIS — F319 Bipolar disorder, unspecified: Secondary | ICD-10-CM | POA: Diagnosis present

## 2013-11-25 DIAGNOSIS — F911 Conduct disorder, childhood-onset type: Secondary | ICD-10-CM | POA: Insufficient documentation

## 2013-11-25 DIAGNOSIS — R5381 Other malaise: Secondary | ICD-10-CM | POA: Insufficient documentation

## 2013-11-25 DIAGNOSIS — R4689 Other symptoms and signs involving appearance and behavior: Secondary | ICD-10-CM

## 2013-11-25 DIAGNOSIS — R5383 Other fatigue: Secondary | ICD-10-CM

## 2013-11-25 DIAGNOSIS — IMO0002 Reserved for concepts with insufficient information to code with codable children: Secondary | ICD-10-CM | POA: Insufficient documentation

## 2013-11-25 DIAGNOSIS — F39 Unspecified mood [affective] disorder: Secondary | ICD-10-CM

## 2013-11-25 DIAGNOSIS — F17200 Nicotine dependence, unspecified, uncomplicated: Secondary | ICD-10-CM | POA: Insufficient documentation

## 2013-11-25 DIAGNOSIS — Z87828 Personal history of other (healed) physical injury and trauma: Secondary | ICD-10-CM | POA: Insufficient documentation

## 2013-11-25 DIAGNOSIS — F1210 Cannabis abuse, uncomplicated: Secondary | ICD-10-CM | POA: Insufficient documentation

## 2013-11-25 LAB — COMPREHENSIVE METABOLIC PANEL
ALT: 12 U/L (ref 0–53)
AST: 27 U/L (ref 0–37)
Albumin: 4.5 g/dL (ref 3.5–5.2)
Alkaline Phosphatase: 96 U/L (ref 39–117)
Anion gap: 15 (ref 5–15)
BUN: 10 mg/dL (ref 6–23)
CALCIUM: 9.7 mg/dL (ref 8.4–10.5)
CHLORIDE: 102 meq/L (ref 96–112)
CO2: 24 mEq/L (ref 19–32)
Creatinine, Ser: 1.06 mg/dL (ref 0.50–1.35)
GFR calc Af Amer: >90 mL/min (ref >90)
GFR calc non Af Amer: >90 mL/min (ref >90)
Glucose, Bld: 98 mg/dL (ref 70–99)
Potassium: 3.8 mEq/L (ref 3.7–5.3)
Sodium: 141 mEq/L (ref 137–147)
TOTAL PROTEIN: 8.5 g/dL — AB (ref 6.0–8.3)
Total Bilirubin: 0.7 mg/dL (ref 0.3–1.2)

## 2013-11-25 LAB — RAPID URINE DRUG SCREEN, HOSP PERFORMED
Amphetamines: NOT DETECTED
BENZODIAZEPINES: NOT DETECTED
Barbiturates: NOT DETECTED
COCAINE: NOT DETECTED
OPIATES: NOT DETECTED
Tetrahydrocannabinol: POSITIVE — AB

## 2013-11-25 LAB — CBC
HCT: 42.1 % (ref 39.0–52.0)
Hemoglobin: 13.8 g/dL (ref 13.0–17.0)
MCH: 21.8 pg — AB (ref 26.0–34.0)
MCHC: 32.8 g/dL (ref 30.0–36.0)
MCV: 66.5 fL — ABNORMAL LOW (ref 78.0–100.0)
Platelets: 229 10*3/uL (ref 150–400)
RBC: 6.33 MIL/uL — ABNORMAL HIGH (ref 4.22–5.81)
RDW: 15.3 % (ref 11.5–15.5)
WBC: 7.6 10*3/uL (ref 4.0–10.5)

## 2013-11-25 LAB — ACETAMINOPHEN LEVEL: Acetaminophen (Tylenol), Serum: <15 ug/mL (ref 10–30)

## 2013-11-25 LAB — ETHANOL

## 2013-11-25 LAB — SALICYLATE LEVEL

## 2013-11-25 MED ORDER — VITAMINS A & D EX OINT
TOPICAL_OINTMENT | CUTANEOUS | Status: DC | PRN
  Administered 2013-11-27: 1 via TOPICAL
  Filled 2013-11-25 (×3): qty 5

## 2013-11-25 MED ORDER — LORAZEPAM 1 MG PO TABS: 1.0000 mg | ORAL_TABLET | Freq: Three times a day (TID) | ORAL | Status: DC | PRN

## 2013-11-25 MED ORDER — ONDANSETRON HCL 4 MG PO TABS: 4.0000 mg | ORAL_TABLET | Freq: Three times a day (TID) | ORAL | Status: DC | PRN

## 2013-11-25 MED ORDER — ALUM & MAG HYDROXIDE-SIMETH 200-200-20 MG/5ML PO SUSP: 30.0000 mL | ORAL | Status: DC | PRN

## 2013-11-25 MED ORDER — ZIPRASIDONE MESYLATE 20 MG IM SOLR: 10.0000 mg | Freq: Once | INTRAMUSCULAR | Status: DC

## 2013-11-25 MED ORDER — ACETAMINOPHEN 325 MG PO TABS: 650.0000 mg | ORAL_TABLET | ORAL | Status: DC | PRN

## 2013-11-25 MED ORDER — IBUPROFEN 200 MG PO TABS: 600.0000 mg | ORAL_TABLET | Freq: Three times a day (TID) | ORAL | Status: DC | PRN

## 2013-11-25 MED ORDER — LORAZEPAM 2 MG/ML IJ SOLN
2.0000 mg | Freq: Once | INTRAMUSCULAR | Status: AC
  Administered 2013-11-25: 2 mg via INTRAMUSCULAR
  Filled 2013-11-25: qty 1

## 2013-11-25 MED ORDER — ZIPRASIDONE MESYLATE 20 MG IM SOLR
10.0000 mg | Freq: Once | INTRAMUSCULAR | Status: AC
  Administered 2013-11-25: 10 mg via INTRAMUSCULAR
  Filled 2013-11-25: qty 20

## 2013-11-25 MED ORDER — NICOTINE 21 MG/24HR TD PT24
21.0000 mg | MEDICATED_PATCH | Freq: Every day | TRANSDERMAL | Status: DC
  Filled 2013-11-25: qty 1
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
## 2013-11-25 NOTE — ED Notes (Signed)
Pt sleeping quietly at this time. [employer] was allowed to leave d/t pt sleeping

## 2013-11-25 NOTE — ED Notes (Signed)
Pt arrived to the ED with a complaint of schizophrenia.  Pt was seen earlier in the morning but was voluntary and left.  Mother then took out IVC paperwork due to the pt's lack of reality.  Pt is talking word salad and skipping ideas rapidly.  According to IVC paperwork he states demons are following him and that he is going to kill himself.

## 2013-11-25 NOTE — ED Notes (Signed)
Pt moved to room 40,  Museum/gallery curator receiving pt.

## 2013-11-25 NOTE — ED Notes (Signed)
Psychiatry at bedside.

## 2013-11-25 NOTE — ED Notes (Signed)
Pt mother called and requesting status on pt. Pt mtoher wants to be notified of change or relocation- [phone]
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
## 2013-11-25 NOTE — ED Notes (Signed)
Pt's mother called wants [place] to notify him if he gets moved to another facility
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
## 2013-11-25 NOTE — ED Notes (Addendum)
Prior tech and RN attempted to obtain blood samples. They were unsuccessful. Patient refused and combative.

## 2013-11-25 NOTE — Consult Note (Signed)
Patient is sleeping and will not wake.  Attempted to shake patient and called name loudly but still will not wake.  Patient was given Geodon injection earlier. Will attempt to assess later today,  Shuvon B. Rankin FNP-BC  Seen with NP as above- will follow.

## 2013-11-26 ENCOUNTER — Encounter (HOSPITAL_COMMUNITY): Payer: Self-pay | Admitting: Psychiatry

## 2013-11-26 DIAGNOSIS — R451 Restlessness and agitation: Secondary | ICD-10-CM | POA: Diagnosis present

## 2013-11-26 DIAGNOSIS — F39 Unspecified mood [affective] disorder: Secondary | ICD-10-CM | POA: Diagnosis present

## 2013-11-26 MED ORDER — LORAZEPAM 2 MG/ML IJ SOLN
2.0000 mg | Freq: Once | INTRAMUSCULAR | Status: AC
  Administered 2013-11-26: 2 mg via INTRAMUSCULAR
  Filled 2013-11-26: qty 1

## 2013-11-26 MED ORDER — QUETIAPINE FUMARATE 50 MG PO TABS: 50.0000 mg | ORAL_TABLET | Freq: Every day | ORAL | Status: DC

## 2013-11-26 MED ORDER — QUETIAPINE FUMARATE 25 MG PO TABS
25.0000 mg | ORAL_TABLET | Freq: Three times a day (TID) | ORAL | Status: DC
  Administered 2013-11-27: 25 mg via ORAL
  Filled 2013-11-26 (×3): qty 1

## 2013-11-26 MED ORDER — OLANZAPINE 10 MG IM SOLR
10.0000 mg | Freq: Once | INTRAMUSCULAR | Status: AC
  Administered 2013-11-26: 10 mg via INTRAMUSCULAR
  Filled 2013-11-26: qty 10

## 2013-11-26 MED ORDER — DIPHENHYDRAMINE HCL 50 MG/ML IJ SOLN
50.0000 mg | Freq: Once | INTRAMUSCULAR | Status: AC
  Administered 2013-11-26: 50 mg via INTRAMUSCULAR
  Filled 2013-11-26: qty 1

## 2013-11-26 NOTE — ED Notes (Signed)
Pt sleeping for 1800 vitals. RN notified and told this writer not to disturb
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
## 2013-11-26 NOTE — ED Notes (Signed)
Pt requested to speak with chaplain while chaplain was rounding in ED.   Met pt in day room.  Seated in dayroom, Hurshel showed pressured speech, some lability in mood - moving from energetic to tearful, expressing some tangential thoughts.   Spoke with chaplain about feeling as though he is in "spiritual warfare."  Expressed "regret over things that I have done," and stated that he wants to make different decisions with his life.  Chaplain explored with pt what he has found helpful in ED and how he can think about finding this in his life.  Pt identified becoming elevated in energy and yelling at people in his life.  Expressed that the ED was helpful "giving me some time to myself" and identified "breathing and praying" as ways that he can de-escalate when he feels overwhelmed.  In pt speaking about his desire to leave, chaplain continually redirected to what pt is finding helpful and tying ED into pt's goals of "making different decisions."  [facility], [place]

## 2013-11-26 NOTE — ED Notes (Signed)
Patient up at nursing station most of the morning.  Hyper-verbal, tangential, and flight of ideas.  Became angry and was demanding to be discharged.  Wanted to sign AMA.  Advised patient that he could not do that as he was under involuntary commitment.  He continued to state he wanted to get a shot and be discharged today.  His voice was escalating and he was argumentative.  He was given injections of lorazepam 2 mg, olanzapine 10 mg and diphenhydramine 50 mg.  He then stayed up in the hallway for almost an hour.  He was escorted to bed when he was starting to fall asleep in the hallway.  He was placed in the bed and is still asleep at this time.

## 2013-11-26 NOTE — ED Provider Notes (Signed)
Medical screening examination/treatment/procedure(s) were conducted as a shared visit with non-physician practitioner(s) and myself.  I personally evaluated the patient during the encounter.   EKG Interpretation None       Patient agitated, combative, given geodon and ativan. Psych eval pending.  Audree Camel, MD 11/26/13 [phone]
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
## 2013-11-26 NOTE — ED Notes (Signed)
Pt given a meal try, however he refused it stating "Yo, I don't need that tray, I'm not gonna eat here. I'll eat when I get home."

## 2013-11-27 DIAGNOSIS — F259 Schizoaffective disorder, unspecified: Secondary | ICD-10-CM

## 2013-11-27 MED ORDER — ARIPIPRAZOLE 5 MG PO TABS
5.0000 mg | ORAL_TABLET | Freq: Every day | ORAL | Status: DC
  Administered 2013-11-27: 5 mg via ORAL
  Filled 2013-11-27: qty 1

## 2013-11-27 MED ORDER — ARIPIPRAZOLE ER 400 MG IM SUSR: 400.0000 mg | INTRAMUSCULAR | Status: DC

## 2013-11-27 MED ORDER — ARIPIPRAZOLE ER 400 MG IM SUSR
400.0000 mg | INTRAMUSCULAR | Status: DC
  Administered 2013-11-27: 400 mg via INTRAMUSCULAR

## 2013-11-27 MED ORDER — ARIPIPRAZOLE 5 MG PO TABS: 5.0000 mg | ORAL_TABLET | Freq: Every day | ORAL | Status: DC

## 2013-11-27 NOTE — ED Notes (Signed)
Patient up and about on the unit much of the day.  Has had some grandiose ideations, but has been cooperative and polite.  He was given aripiprazole 5 mg PO and an injection of the same medication after an hour.  He was given a bus pass and a prescription for PO medication and instructed to take this for about 10 days as the IM worked into his system.  All belongings returned.  Patient denies any thoughts of self harm or harm to others.

## 2013-11-27 NOTE — BH Assessment (Signed)
Per Dr. Ladona Ridgel and [facility] discharge home. Patient provided with a list of referrals to [facility] and mobile crises prior to discharge.

## 2013-11-27 NOTE — ED Notes (Signed)
Chaplain consult at pt request.   Adrian Mcgrath requested prayers with chaplain.  Chaplain met Adrian Mcgrath in day room.  [name] wished to pray for strength, peace, comfort, guidance for his upcoming discharge.    [place], [place]

## 2013-11-27 NOTE — Consult Note (Signed)
  Psychiatric Specialty Exam: Physical Exam  ROS  Blood pressure 133/71, pulse 94, temperature 98.1 F (36.7 C), temperature source Oral, resp. rate 18, SpO2 100.00%.There is no weight on file to calculate BMI.  General Appearance: Well Groomed  Patent attorney::  Good  Speech:  Clear and Coherent  Volume:  Normal  Mood:  Anxious  Affect:  Appropriate  Thought Process:  Coherent and Logical  Orientation:  Full (Time, Place, and Person)  Thought Content:  Negative  Suicidal Thoughts:  No  Homicidal Thoughts:  No  Memory:  Immediate;   Good Recent;   Good Remote;   Good  Judgement:  Intact  Insight:  Fair  Psychomotor Activity:  Normal  Concentration:  Good  Recall:  Good  Akathisia:  Negative  Handed:  Right  AIMS (if indicated):     Assets:  Communication Skills Intimacy Physical Health  Sleep:   adequate  Mr Teel is appropriate, logical, denying any hallucinations and wanting to go home.  He knows he cannot go home to his mother but says he has a friend he can stay with.  His mother wanted him to take a long acting injection and he agrees to that plan He was given Abilify Maintenna 400 mg after a trial dose of aripiprazole.  The plan is to discharge him home after his injection to be followed at [facility] where he already goes.

## 2013-11-27 NOTE — ED Notes (Signed)
Patient refuses to wake up to take medications.

## 2013-11-27 NOTE — ED Notes (Signed)
Patient at nurses station demanding to go home. Patient stated " I was lied too." I was told I could go home after I get my shots". Patient requesting to talk to psychiatrist. Patient informed that psychiatry will be in between 8:30 am and 9:00 am. Patient is verbally aggressive at this time. Patient is verbally deescalated by Clinical research associate. Patient requested a shower and shower material given to patient. Encouragement and support provided and safety maintain.

## 2013-11-27 NOTE — BH Assessment (Signed)
11/27/2013  Dr. Kellie Shropshire, NP recommend inpatient treatment. Patient is a 400 hall candidate at [facility] but no beds available at this time. TTS to seek alternative placement.

## 2013-11-27 NOTE — ED Notes (Signed)
Donell Sievert, PA contacted and informed that patient wanted to take his am dose of Seroquel. Donell Sievert, PA gave verbal orders and orders read back and verified.

## 2013-11-27 NOTE — ED Notes (Signed)
Patient at desk asking question about his medications. Patient informed that he needed to start taking his daily Seroquel. Patient then requested  his Seroquel now. Patient informed that his next dose of medications is due at 8:00 am. Encouragement and support provided. Safety Maintain.
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
## 2013-11-27 NOTE — Discharge Instructions (Signed)
Aggression Physically aggressive behavior is common among small children. When frustrated or angry, toddlers may act out. Often, they will push, bite, or hit. Most children show less physical aggression as they grow up. Their language and interpersonal skills improve, too. But continued aggressive behavior is a sign of a problem. This behavior can lead to aggression and delinquency in adolescence and adulthood. Aggressive behavior can be psychological or physical. Forms of psychological aggression include threatening or bullying others. Forms of physical aggression include:  Pushing.  Hitting.  Slapping.  Kicking.  Stabbing.  Shooting.  Raping. PREVENTION  Encouraging the following behaviors can help manage aggression:  Respecting others and valuing differences.  Participating in school and community functions, including sports, music, after-school programs, community groups, and volunteer work.  Talking with an adult when they are sad, depressed, fearful, anxious, or angry. Discussions with a parent or other family member, Veterinary surgeon, Runner, broadcasting/film/video, or coach can help.  Avoiding alcohol and drug use.  Dealing with disagreements without aggression, such as conflict resolution. To learn this, children need parents and caregivers to model respectful communication and problem solving.  Limiting exposure to aggression and violence, such as video games that are not age appropriate, violence in the media, or domestic violence. Document Released: 12/20/2006 Document Revised: 05/17/2011 Document Reviewed: 04/30/2010 [facility] Patient Information 2015 [place], [place]. This information is not intended to replace advice given to you by your health care provider. Make sure you discuss any questions you have with your health care provider.  Depression Depression refers to feeling sad, low, down in the dumps, blue, gloomy, or empty. In general, there are two kinds of depression: 1. Normal sadness or  normal grief. This kind of depression is one that we all feel from time to time after upsetting life experiences, such as the loss of a job or the ending of a relationship. This kind of depression is considered normal, is short lived, and resolves within a few days to 2 weeks. Depression experienced after the loss of a loved one (bereavement) often lasts longer than 2 weeks but normally gets better with time. 2. Clinical depression. This kind of depression lasts longer than normal sadness or normal grief or interferes with your ability to function at home, at work, and in school. It also interferes with your personal relationships. It affects almost every aspect of your life. Clinical depression is an illness. Symptoms of depression can also be caused by conditions other than those mentioned above, such as:  Physical illness. Some physical illnesses, including underactive thyroid gland (hypothyroidism), severe anemia, specific types of cancer, diabetes, uncontrolled seizures, heart and lung problems, strokes, and chronic pain are commonly associated with symptoms of depression.  Side effects of some prescription medicine. In some people, certain types of medicine can cause symptoms of depression.  Substance abuse. Abuse of alcohol and illicit drugs can cause symptoms of depression. SYMPTOMS Symptoms of normal sadness and normal grief include the following:  Feeling sad or crying for short periods of time.  Not caring about anything (apathy).  Difficulty sleeping or sleeping too much.  No longer able to enjoy the things you used to enjoy.  Desire to be by oneself all the time (social isolation).  Lack of energy or motivation.  Difficulty concentrating or remembering.  Change in appetite or weight.  Restlessness or agitation. Symptoms of clinical depression include the same symptoms of normal sadness or normal grief and also the following symptoms:  Feeling sad or crying all the  time.  Feelings of guilt or worthlessness.  Feelings of hopelessness or helplessness.  Thoughts of suicide or the desire to harm yourself (suicidal ideation).  Loss of touch with reality (psychotic symptoms). Seeing or hearing things that are not real (hallucinations) or having false beliefs about your life or the people around you (delusions and paranoia). DIAGNOSIS  The diagnosis of clinical depression is usually based on how bad the symptoms are and how long they have lasted. Your health care provider will also ask you questions about your medical history and substance use to find out if physical illness, use of prescription medicine, or substance abuse is causing your depression. Your health care provider may also order blood tests. TREATMENT  Often, normal sadness and normal grief do not require treatment. However, sometimes antidepressant medicine is given for bereavement to ease the depressive symptoms until they resolve. The treatment for clinical depression depends on how bad the symptoms are but often includes antidepressant medicine, counseling with a mental health professional, or both. Your health care provider will help to determine what treatment is best for you. Depression caused by physical illness usually goes away with appropriate medical treatment of the illness. If prescription medicine is causing depression, talk with your health care provider about stopping the medicine, decreasing the dose, or changing to another medicine. Depression caused by the abuse of alcohol or illicit drugs goes away when you stop using these substances. Some adults need professional help in order to stop drinking or using drugs. SEEK IMMEDIATE MEDICAL CARE IF:  You have thoughts about hurting yourself or others.  You lose touch with reality (have psychotic symptoms).  You are taking medicine for depression and have a serious side effect. FOR MORE INFORMATION  National Alliance on Mental  Illness: www.nami.[employer] of Mental Health: http://www.maynard.net/ Document Released: 02/20/2000 Document Revised: 07/09/2013 Document Reviewed: 05/24/2011 [facility] Patient Information 2015 [place], [place]. This information is not intended to replace advice given to you by your health care provider. Make sure you discuss any questions you have with your health care provider.

## 2013-11-27 NOTE — ED Notes (Signed)
Patient took medications with without resistance. Patient calm, cooperative and pleasant at this time. Safety maintain.

## 2013-11-27 NOTE — ED Notes (Signed)
Patient informed Clinical research associate " It's not fair that I can't go home" patient began crying Clinical research associate consoled patient and offered support. Writer offered patient medications and patient agreed to take medications. Patient calmed down and took medications without resistance. Encouragement and support provided and safety maintain.

## 2014-06-17 ENCOUNTER — Other Ambulatory Visit (HOSPITAL_COMMUNITY)
Admission: RE | Admit: 2014-06-17 | Discharge: 2014-06-17 | Disposition: A | Discharge status: Home / Self Care | Payer: Self-pay | Source: Ambulatory Visit | Attending: Emergency Medicine | Admitting: Emergency Medicine

## 2014-06-17 ENCOUNTER — Emergency Department (INDEPENDENT_AMBULATORY_CARE_PROVIDER_SITE_OTHER)
Admission: EM | Admit: 2014-06-17 | Discharge: 2014-06-17 | Disposition: A | Discharge status: Home / Self Care | Payer: Self-pay | Source: Home / Self Care | Attending: Emergency Medicine | Admitting: Emergency Medicine

## 2014-06-17 ENCOUNTER — Encounter (HOSPITAL_COMMUNITY): Payer: Self-pay | Admitting: Emergency Medicine

## 2014-06-17 DIAGNOSIS — N342 Other urethritis: Secondary | ICD-10-CM

## 2014-06-17 DIAGNOSIS — Z113 Encounter for screening for infections with a predominantly sexual mode of transmission: Secondary | ICD-10-CM | POA: Insufficient documentation

## 2014-06-17 MED ORDER — ONDANSETRON 4 MG PO TBDP
ORAL_TABLET | ORAL | Status: AC
  Filled 2014-06-17: qty 1

## 2014-06-17 MED ORDER — AZITHROMYCIN 250 MG PO TABS
2000.0000 mg | ORAL_TABLET | Freq: Once | ORAL | Status: AC
  Administered 2014-06-17: 2000 mg via ORAL

## 2014-06-17 MED ORDER — ONDANSETRON 4 MG PO TBDP
4.0000 mg | ORAL_TABLET | Freq: Once | ORAL | Status: AC
  Administered 2014-06-17: 4 mg via ORAL

## 2014-06-17 MED ORDER — AZITHROMYCIN 250 MG PO TABS
ORAL_TABLET | ORAL | Status: AC
  Filled 2014-06-17: qty 8
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
## 2014-06-17 NOTE — ED Notes (Signed)
Pt states that he believes he was exposed to a STD and is having some yellow penile discharge

## 2014-06-17 NOTE — Discharge Instructions (Signed)
If any of your tests are positive you should have a repeat test in a week to make sure the infection has resolved.   Urethritis Urethritis is an inflammation of the tube through which urine exits your bladder (urethra).  CAUSES Urethritis is often caused by an infection in your urethra. The infection can be viral, like herpes. The infection can also be bacterial, like gonorrhea. RISK FACTORS Risk factors of urethritis include:  Having sex without using a condom.  Having multiple sexual partners.  Having poor hygiene. SIGNS AND SYMPTOMS Symptoms of urethritis are less noticeable in women than in men. These symptoms include:  Burning feeling when you urinate (dysuria).  Discharge from your urethra.  Blood in your urine (hematuria).  Urinating more than usual. DIAGNOSIS  To confirm a diagnosis of urethritis, your health care provider will do the following:  Ask about your sexual history.  Perform a physical exam.  Have you provide a sample of your urine for lab testing.  Use a cotton swab to gently collect a sample from your urethra for lab testing. TREATMENT  It is important to treat urethritis. Depending on the cause, untreated urethritis may lead to serious genital infections and possibly infertility. Urethritis caused by a bacterial infection is treated with antibiotic medicine. All sexual partners must be treated.  HOME CARE INSTRUCTIONS  Do not have sex until the test results are known and treatment is completed, even if your symptoms go away before you finish treatment.  If you were prescribed an antibiotic, finish it all even if you start to feel better. SEEK MEDICAL CARE IF:   Your symptoms are not improved in 3 days.  Your symptoms are getting worse.  You develop abdominal pain or pelvic pain (in women).  You develop joint pain.  You have a fever. SEEK IMMEDIATE MEDICAL CARE IF:   You have severe pain in the belly, back, or side.  You have repeated  vomiting. MAKE SURE YOU:  Understand these instructions.  Will watch your condition.  Will get help right away if you are not doing well or get worse. Document Released: 08/18/2000 Document Revised: 07/09/2013 Document Reviewed: 10/23/2012 [facility] Patient Information 2015 [employer], [place]. This information is not intended to replace advice given to you by your health care provider. Make sure you discuss any questions you have with your health care provider.

## 2014-06-18 LAB — RPR: RPR: NONREACTIVE

## 2014-06-18 LAB — URINE CYTOLOGY ANCILLARY ONLY
Chlamydia: NEGATIVE
Neisseria Gonorrhea: POSITIVE — AB
TRICH (WINDOWPATH): NEGATIVE

## 2014-06-18 LAB — HIV ANTIBODY (ROUTINE TESTING W REFLEX): HIV Screen 4th Generation wRfx: NONREACTIVE

## 2014-06-19 NOTE — ED Notes (Signed)
Patient called to check on lab reports. As all are not yet available , patient was advised to call back or to wait for call from RN checking labs

## 2014-06-20 ENCOUNTER — Telehealth (HOSPITAL_COMMUNITY): Payer: Self-pay | Admitting: *Deleted

## 2014-06-20 NOTE — ED Notes (Addendum)
GC pos., Chlamydia and Trich neg., HIV/RPR non-reactive.  Pt. called in for his results.  Pt. verified x 2 and given results.  Pt. wants to come back today to get rechecke.  I told him the providers note said for him to come back in 1 week to test for cure ( 4/18).  Pt. reminded no sex until result is back.  Pt. instructed to notify his partner, no sex for 1 week and then to practice safe sex. Pt. told he [facility], by appointment.  Pt. voiced understanding.   [facility] and she said the new CDC guidelines are for Gentamycin 240 mg. IM with the Zithromax 2 gm.  She recommends pt. get the Gentamycin when he comes back.  Dr. Denyse Amassorey and Dr. Konrad DoloresMerrell notified and they do not want to treat pt. with Gentamycin if he is cured.  They both say to wait for the result.  Plan is to get 1 dose of Gentamycin in our Pyxis for this situation in the future.  Prentice DockerBrandi Sessoms with [facility] notified.  She said to send form after result is back.   Vassie MoselleYork, Tanikka Bresnan M 06/20/2014 I called pt. and reminded him that he was supposed to have come back today to be tested for a cure.  He said he was coming back tomorrow. 06/24/2014 Health Dept. Notified that pt has not returned to be tested for a cure. 06/27/2014

## 2014-06-27 ENCOUNTER — Emergency Department (INDEPENDENT_AMBULATORY_CARE_PROVIDER_SITE_OTHER)
Admission: EM | Admit: 2014-06-27 | Discharge: 2014-06-27 | Disposition: A | Discharge status: Home / Self Care | Payer: Self-pay | Source: Home / Self Care | Attending: Family Medicine | Admitting: Family Medicine

## 2014-06-27 ENCOUNTER — Encounter (HOSPITAL_COMMUNITY): Payer: Self-pay | Admitting: Emergency Medicine

## 2014-06-27 DIAGNOSIS — A549 Gonococcal infection, unspecified: Secondary | ICD-10-CM

## 2014-06-27 MED ORDER — AZITHROMYCIN 250 MG PO TABS
ORAL_TABLET | ORAL | Status: AC
  Filled 2014-06-27: qty 8

## 2014-06-27 MED ORDER — AZITHROMYCIN 250 MG PO TABS
2000.0000 mg | ORAL_TABLET | Freq: Once | ORAL | Status: AC
  Administered 2014-06-27: 2000 mg via ORAL

## 2014-06-27 MED ORDER — GENTAMICIN SULFATE 40 MG/ML IJ SOLN
240.0000 mg | Freq: Once | INTRAMUSCULAR | Status: AC
  Administered 2014-06-27: 240 mg via INTRAMUSCULAR
  Filled 2014-06-27: qty 6

## 2014-06-27 NOTE — ED Notes (Signed)
Pt is here for follow up on STD treatment
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
## 2014-06-27 NOTE — Discharge Instructions (Signed)
Sexually Transmitted Disease °A sexually transmitted disease (STD) is a disease or infection that may be passed (transmitted) from person to person, usually during sexual activity. This may happen by way of saliva, semen, blood, vaginal mucus, or urine. Common STDs include:  °· Gonorrhea.   °· Chlamydia.   °· Syphilis.   °· HIV and AIDS.   °· Genital herpes.   °· Hepatitis B and C.   °· Trichomonas.   °· Human papillomavirus (HPV).   °· Pubic lice.   °· Scabies. °· Mites. °· Bacterial vaginosis. °WHAT ARE CAUSES OF STDs? °An STD may be caused by bacteria, a virus, or parasites. STDs are often transmitted during sexual activity if one person is infected. However, they may also be transmitted through nonsexual means. STDs may be transmitted after:  °· Sexual intercourse with an infected person.   °· Sharing sex toys with an infected person.   °· Sharing needles with an infected person or using unclean piercing or tattoo needles. °· Having intimate contact with the genitals, mouth, or rectal areas of an infected person.   °· Exposure to infected fluids during birth. °WHAT ARE THE SIGNS AND SYMPTOMS OF STDs? °Different STDs have different symptoms. Some people may not have any symptoms. If symptoms are present, they may include:  °· Painful or bloody urination.   °· Pain in the pelvis, abdomen, vagina, anus, throat, or eyes.   °· A skin rash, itching, or irritation. °· Growths, ulcerations, blisters, or sores in the genital and anal areas. °· Abnormal vaginal discharge with or without bad odor.   °· Penile discharge in men.   °· Fever.   °· Pain or bleeding during sexual intercourse.   °· Swollen glands in the groin area.   °· Yellow skin and eyes (jaundice). This is seen with hepatitis.   °· Swollen testicles. °· Infertility. °· Sores and blisters in the mouth. °HOW ARE STDs DIAGNOSED? °To make a diagnosis, your health care provider may:  °· Take a medical history.   °· Perform a physical exam.   °· Take a sample of  any discharge to examine. °· Swab the throat, cervix, opening to the penis, rectum, or vagina for testing. °· Test a sample of your first morning urine.   °· Perform blood tests.   °· Perform a Pap test, if this applies.   °· Perform a colposcopy.   °· Perform a laparoscopy.   °HOW ARE STDs TREATED? ° Treatment depends on the STD. Some STDs may be treated but not cured.  °· Chlamydia, gonorrhea, trichomonas, and syphilis can be cured with antibiotic medicine.   °· Genital herpes, hepatitis, and HIV can be treated, but not cured, with prescribed medicines. The medicines lessen symptoms.   °· Genital warts from HPV can be treated with medicine or by freezing, burning (electrocautery), or surgery. Warts may come back.   °· HPV cannot be cured with medicine or surgery. However, abnormal areas may be removed from the cervix, vagina, or vulva.   °· If your diagnosis is confirmed, your recent sexual partners need treatment. This is true even if they are symptom-free or have a negative culture or evaluation. They should not have sex until their health care providers say it is okay. °HOW CAN I REDUCE MY RISK OF GETTING AN STD? °Take these steps to reduce your risk of getting an STD: °· Use latex condoms, dental dams, and water-soluble lubricants during sexual activity. Do not use petroleum jelly or oils. °· Avoid having multiple sex partners. °· Do not have sex with someone who has other sex partners. °· Do not have sex with anyone you do not know or who is at   high risk for an STD.  Avoid risky sex practices that can break your skin.  Do not have sex if you have open sores on your mouth or skin.  Avoid drinking too much alcohol or taking illegal drugs. Alcohol and drugs can affect your judgment and put you in a vulnerable position.  Avoid engaging in oral and anal sex acts.  Get vaccinated for HPV and hepatitis. If you have not received these vaccines in the past, talk to your health care provider about whether one  or both might be right for you.   If you are at risk of being infected with HIV, it is recommended that you take a prescription medicine daily to prevent HIV infection. This is called pre-exposure prophylaxis (PrEP). You are considered at risk if:  You are a man who has sex with other men (MSM).  You are a heterosexual man or woman and are sexually active with more than one partner.  You take drugs by injection.  You are sexually active with a partner who has HIV.  Talk with your health care provider about whether you are at high risk of being infected with HIV. If you choose to begin PrEP, you should first be tested for HIV. You should then be tested every 3 months for as long as you are taking PrEP.  WHAT SHOULD I DO IF I THINK I HAVE AN STD?  See your health care provider.   Tell your sexual partner(s). They should be tested and treated for any STDs.  Do not have sex until your health care provider says it is okay. WHEN SHOULD I GET IMMEDIATE MEDICAL CARE? Contact your health care provider right away if:   You have severe abdominal pain.  You are a man and notice swelling or pain in your testicles.  You are a woman and notice swelling or pain in your vagina. Document Released: 05/15/2002 Document Revised: 02/27/2013 Document Reviewed: 09/12/2012 ExitCare Patient Information 2015 [employer]. This information is not intended to replace advice given to you by your health care provider. Make sure you discuss any questions you have with your health care provider. Gonorrhea Gonorrhea is an infection that can cause serious problems. If left untreated, the infection may:   Damage the male or male organs.   Cause women to be unable to have children (sterility).   Harm a fetus if the infected woman is pregnant.  It is important to get treatment for gonorrhea as soon as possible. It is also necessary that all your sexual partners be tested for the infection.  CAUSES   Gonorrhea is caused by bacteria called Neisseria gonorrhoeae. The infection is spread from person to person, usually by sexual contact (such as by anal, vaginal, or oral means). A newborn can contract the infection from his or her mother during birth.  SYMPTOMS  Some people with gonorrhea do not have symptoms. Symptoms may be different in females and males.  Females The most common symptoms are:   Pain in the lower abdomen.   Fever with or without chills.  Other symptoms include:   Abnormal vaginal discharge.   Painful intercourse.   Burning or itching of the vagina or lips of the vagina.   Abnormal vaginal bleeding.   Pain when urinating.   Long-lasting (chronic) pain in the lower abdomen, especially during menstruation or intercourse.   Inability to become pregnant.   Going into premature labor.   Irritation, pain, bleeding, or discharge from the rectum. This may   occur if the infection was spread by anal sex.   Sore throat or swollen lymph nodes in the neck. This may occur if the infection was spread by oral sex.  Males The most common symptoms are:   Discharge from the penis.   Pain or burning during urination.   Pain or swelling in the testicles. Other symptoms may include:   Irritation, pain, bleeding, or discharge from the rectum. This may occur if the infection was spread by anal sex.   Sore throat, fever, or swollen lymph nodes in the neck. This may occur if the infection was spread by oral sex.  DIAGNOSIS  A diagnosis is made after a physical exam is done and a sample of discharge is examined under a microscope for the presence of the bacteria. The discharge may be taken from the urethra, cervix, throat, or rectum.  TREATMENT  Gonorrhea is treated with antibiotic medicines. It is important for treatment to begin as soon as possible. Early treatment may prevent some problems from developing.  HOME CARE INSTRUCTIONS   Take medicines only as  directed by your health care provider.   Take your antibiotic medicine as directed by your health care provider. Finish the antibiotic even if you start to feel better. Incomplete treatment will put you at risk for continued infection.   Do not have sex until treatment is complete or as directed by your health care provider.   Keep all follow-up visits as directed by your health care provider.   Not all test results are available during your visit. If your test results are not back during the visit, make an appointment with your health care provider to find out the results. Do not assume everything is normal if you have not heard from your health care provider or the medical facility. It is your responsibility to get your test results.  If you test positive for gonorrhea, inform your recent sexual partners. They need to be checked for gonorrhea even if they do not have symptoms. They may need treatment, even if they test negative for gonorrhea.  SEEK MEDICAL CARE IF:   You develop any bad reaction to the medicine you were prescribed. This may include:   A rash.   Nausea.   Vomiting.   Diarrhea.   Your symptoms do not improve after a few days of taking antibiotics.   Your symptoms get worse.   You develop increased pain, such as in the testicles (for males) or in the abdomen (for females).  You have a fever. MAKE SURE YOU:   Understand these instructions.  Will watch your condition.  Will get help right away if you are not doing well or get worse. Document Released: 02/20/2000 Document Revised: 07/09/2013 Document Reviewed: 08/30/2012 ExitCare Patient Information 2015 [employer]. This information is not intended to replace advice given to you by your health care provider. Make sure you discuss any questions you have with your health care provider.  

## 2014-06-28 NOTE — ED Notes (Signed)
DHHS form completed and [facility]. Vassie MoselleYork, Mariha Sleeper M 06/28/2014

## 2014-07-23 ENCOUNTER — Emergency Department (HOSPITAL_COMMUNITY): Payer: No Typology Code available for payment source

## 2014-07-23 ENCOUNTER — Encounter (HOSPITAL_COMMUNITY): Payer: Self-pay | Admitting: Emergency Medicine

## 2014-07-23 ENCOUNTER — Emergency Department (HOSPITAL_COMMUNITY)
Admission: EM | Admit: 2014-07-23 | Discharge: 2014-07-23 | Disposition: A | Discharge status: Home / Self Care | Payer: No Typology Code available for payment source | Attending: Emergency Medicine | Admitting: Emergency Medicine

## 2014-07-23 DIAGNOSIS — Z720 Tobacco use: Secondary | ICD-10-CM | POA: Insufficient documentation

## 2014-07-23 DIAGNOSIS — Y9289 Other specified places as the place of occurrence of the external cause: Secondary | ICD-10-CM | POA: Insufficient documentation

## 2014-07-23 DIAGNOSIS — S9032XA Contusion of left foot, initial encounter: Secondary | ICD-10-CM | POA: Insufficient documentation

## 2014-07-23 DIAGNOSIS — F259 Schizoaffective disorder, unspecified: Secondary | ICD-10-CM | POA: Insufficient documentation

## 2014-07-23 DIAGNOSIS — Z79899 Other long term (current) drug therapy: Secondary | ICD-10-CM | POA: Insufficient documentation

## 2014-07-23 DIAGNOSIS — W228XXA Striking against or struck by other objects, initial encounter: Secondary | ICD-10-CM | POA: Insufficient documentation

## 2014-07-23 DIAGNOSIS — Z87828 Personal history of other (healed) physical injury and trauma: Secondary | ICD-10-CM | POA: Insufficient documentation

## 2014-07-23 DIAGNOSIS — Y9389 Activity, other specified: Secondary | ICD-10-CM | POA: Insufficient documentation

## 2014-07-23 DIAGNOSIS — Y990 Civilian activity done for income or pay: Secondary | ICD-10-CM | POA: Insufficient documentation

## 2014-07-23 MED ORDER — NAPROXEN 500 MG PO TABS: 500.0000 mg | ORAL_TABLET | Freq: Two times a day (BID) | ORAL | Status: DC

## 2014-07-23 MED ORDER — NAPROXEN 500 MG PO TABS
500.0000 mg | ORAL_TABLET | Freq: Once | ORAL | Status: AC
  Administered 2014-07-23: 500 mg via ORAL
  Filled 2014-07-23: qty 1

## 2014-07-23 MED ORDER — HYDROCODONE-ACETAMINOPHEN 5-325 MG PO TABS: 1.0000 | ORAL_TABLET | ORAL | Status: DC | PRN

## 2014-07-23 MED ORDER — HYDROCODONE-ACETAMINOPHEN 5-325 MG PO TABS
1.0000 | ORAL_TABLET | Freq: Once | ORAL | Status: AC
  Administered 2014-07-23: 1 via ORAL
  Filled 2014-07-23: qty 1
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
## 2014-07-23 NOTE — Discharge Instructions (Signed)
Please follow the directions provided. Be sure to follow-up with your primary care doctor in a few days to make sure you're getting better. If the pain does not improve or worsens in any way, please follow-up with the orthopedic doctor listed for further evaluation. May where your wooden shoe and usual crutches as needed for comfort. Please take naproxen twice a day to help with inflammation. May use the Vicodin for pain not relieved by the naproxen. He may use ice 20 minutes on, 20 minutes off, help with discomfort. Don't hesitate to return for any new, worsening, or concerning symptoms.   SEEK IMMEDIATE MEDICAL CARE IF:  You have increased redness, swelling, or pain in your foot.  Your swelling or pain is not relieved with medicines.  You have loss of feeling in your foot or are unable to move your toes.  Your foot turns cold or blue.  You have pain when you move your toes.  Your foot becomes warm to the touch.  Your contusion does not improve in 2 days.   Emergency Department Resource Guide 1) Find a Doctor and Pay Out of Pocket Although you won't have to find out who is covered by your insurance plan, it is a good idea to ask around and get recommendations. You will then need to call the office and see if the doctor you have chosen will accept you as a new patient and what types of options they offer for patients who are self-pay. Some doctors offer discounts or will set up payment plans for their patients who do not have insurance, but you will need to ask so you aren't surprised when you get to your appointment.  2) Contact Your Local Health Department Not all health departments have doctors that can see patients for sick visits, but many do, so it is worth a call to see if yours does. If you don't know where your local health department is, you can check in your phone book. The CDC also has a tool to help you locate your state's health department, and many state websites also have listings  of all of their local health departments.  3) Find a Walk-in Clinic If your illness is not likely to be very severe or complicated, you may want to try a walk in clinic. These are popping up all over the country in pharmacies, drugstores, and shopping centers. They're usually staffed by nurse practitioners or physician assistants that have been trained to treat common illnesses and complaints. They're usually fairly quick and inexpensive. However, if you have serious medical issues or chronic medical problems, these are probably not your best option.  No Primary Care Doctor: - Call Health Connect at  [phone] - they can help you locate a primary care doctor that  accepts your insurance, provides certain services, etc. - Physician Referral Service- [phone]  Chronic Pain Problems: Organization         Address  Phone   Notes  [facility]  [phone] Patients need to be referred by their primary care doctor.   Medication Assistance: Organization         Address  Phone   Notes  [facility] Medication [facility] [address] [place] [place], [place] [address] [phone] --Must be a resident of [facility] -- Must have NO insurance coverage whatsoever (no Medicaid/ Medicare, etc.) -- The pt. MUST have a primary care doctor that directs their care regularly and follows them in the community  MedAssist  [phone]) [phone]   [employer]  [phone]    Agencies that provide inexpensive medical care: Organization         Address  Phone   Notes  [facility]  [phone]   [facility]    [phone]   [facility] [address] [place], [place] [id number] [phone]   [facility] [address], [place] [phone]) [phone]   Planned Parenthood    [phone]) [phone]   [facility]    [phone]) [phone]   [facility]  [address], [place] Phone:  [phone], Fax:  [phone] Hours of Operation:  9 am - 6 pm, M-F.  Also accepts Medicaid/Medicare and self-pay.  [facility]  [address], [place] Phone: [phone], Fax: [phone]. Hours of Operation:  8:30 am - 5:30 pm, M-F.  Also accepts Medicaid and self-pay.  [facility] [place] [address], [place] Phone: [phone]) [phone]   [facility] [address], [place], [place] [phone], Ext. [phone] Mondays & Thursdays: 7-9 AM.  First 15 patients are seen on a first come, first serve basis.    Medicaid-accepting [facility] Providers:  Organization         Address  Phone   Notes  [facility] [address], [place] [phone]) [phone] Also accepts self-pay patients.  [facility] [address] [place] [address] [place], [place]  [phone]) [phone]   [facility] [address], [address], [place] [phone]) [phone]   [facility] [address] [place] [address], [place] [phone]) [phone]   Renaye RakersVeita Bland [address], [place]   [phone]) [phone] Only accepts [place] Access [place]icaid patients after they have their name applied to their card.   Self-Pay (no insurance) in [facility]:  Organization         Address  Phone   Notes  Sickle Cell Patients, [facility] [address], [place] [phone]) [phone]   [facility] [address], [place] [phone]) [phone]   [facility] [place]  [address], [place] [phone]) [phone]   [facility]/Dr. Osei-Bonsu  [address] [place] [address], [place] or [phone], [place] [phone] Phone number for both [place] and [place] locations is the same.  [facility] [address], [place] [phone]) [phone]   [facility]  [address] [place] [address], [place] or [address] [phone]) [phone]   [facility] [address], [place] [phone]) [phone], phone; [phone]) [phone], fax Sees patients 1st and 3rd Saturday of every month.  Must not qualify for public or private insurance (i.e. Medicaid, Medicare, [place] Health Choice, Veterans' Benefits)  Household income should be no more than 200% of the poverty level The clinic cannot treat you if you are pregnant or think you are pregnant  Sexually transmitted diseases are not treated at the clinic.    Dental Care: Organization         Address  Phone  Notes  [facility] Department of [facility] [address], [place] [phone] Accepts children up to age 621 who are enrolled in [place]icaid or [place] Health Choice;  pregnant women with a Medicaid card; and children who have applied for Medicaid or [place] Health Choice, but were declined, whose parents can pay a reduced fee at time of service.  [facility] Department of [facility] [place]  [address], [place] [phone]) [phone] Accepts children up to age 24 who are enrolled in [place]icaid or [place] Health Choice; pregnant women with a Medicaid card; and children who have applied for Medicaid or [place] Health Choice, but were declined, whose parents can pay a reduced fee at time of service.  [facility]  [address], [place] [phone] Patients are seen by appointment only. Walk-ins are not accepted. [facility] will see patients 24 years of age and older. Monday - Tuesday (8am-5pm) Most Wednesdays (8:30-5pm) $30 per visit, cash only  [facility]  [address], [facility] [phone] Patients are seen by appointment only. Walk-ins are not accepted. [facility] will see patients 24 years of age and older. One Wednesday Evening (Monthly: Volunteer Based).  $30 per visit, cash only  [facility]   [phone]) [phone] for adults; Children under age 524, call Graduate Pediatric Dentistry at [phone]) [phone]. Children aged 774-14, please call [phone]) [phone] to request a pediatric application.  Dental services are provided in all areas of dental care including fillings, crowns and bridges, complete and partial dentures, implants, gum treatment, root canals, and extractions. Preventive care is also provided. Treatment is provided to both adults and children. Patients are selected via a lottery and there is often a waiting list.   [facility] [address], [place]  [phone]) [phone] [email or web address]   [facility] [address], [place], [place] [phone], Ext. [phone] Second and Fourth Thursday of each month, opens at 6:30 AM; Clinic ends at 9 AM.  Patients are seen on a first-come first-served basis, and a limited number are seen during each clinic.   [facility]  [address], [place], [place] [phone]   Eligibility Requirements You must have lived in [place], [place], or [place] counties for at least the last three months.   You cannot be eligible for state or federal sponsored National Cityhealthcare insurance, including CIGNAVeterans Administration, [place]icaid, or [employer] EntertainmentMedicare.   You generally cannot be eligible for healthcare insurance through your employer.    How to apply: Eligibility screenings are held every Tuesday and Wednesday afternoon from 1:00 pm until 4:00 pm. You do not need an appointment for the interview!  [facility] [address], [place], [place] [phone]   [facility]  [phone]   [facility]  [phone]   [facility]  [phone]    Behavioral Health Resources in the Community: Intensive Outpatient Programs Organization         Address  Phone  Notes  [facility] [address], [place], [place] [phone]   [facility] [address] [place] [address], [place], [place] [phone]   [facility] [address], [place], [place]  [phone]   [facility] [address],  [place], [place] [phone] or [phone]   Substance Abuse Resources Organization         Address  Phone  Notes  [facility]  [phone]   [facility]  [phone]   [facility]  [phone]  [facility]  [phone]   Residential & Outpatient Substance Abuse Program  [phone]   Psychological Services Organization         Address  Phone  Notes  [facility]  [phone]   [facility]  [phone]   [facility] [address], [place] [phone] or [phone]    Mobile Crisis Teams Organization         Address  Phone  Notes  [facility], Mobile Crisis Care Unit  [phone]   [facility]  [address] [place], [place] [phone]   Doristine LocksSharon DeEsch [address] [place] [phone]    Self-Help/Support Groups Organization         Address  Phone             Notes  [facility] [place] - variety of support groups  336- [phone] Call for more information  Narcotics Anonymous (NA), [facility] [address], [place]  2 meetings at this location   Statisticianesidential Treatment Programs Organization         Address  Phone  Notes  [facility] [address],    [place]  [phone]   [facility]  [address], [place] [phone], [place], [place] [phone]   [facility] [address], [place] [phone] Admissions: 8am-3pm M-F  [facility] [address]    [place], [place] [phone]   [facility] [address], [place], [place] [phone]   [facility] [address]  [place], [place] [phone]     [facility] - Intensive Outpatient [address], [place], [place] [phone]   [facility] ([facility] [address]  [place], [place] [phone] or [phone]   [facility] (RT[facility]) [address] [place], [place] [phone] Accepts Medicaid  [facility] [address]  [place] [phone] Substance Abuse/Addiction Treatment   [facility] Behavioral Health Resources Organization         Address  Phone  Notes  [facility]  [phone]   Angie FavaJulie Brannon, PhD [address], [address] [place], [place]   [phone]) [phone] or [phone]) [phone]   [facility]   [address] [place], [place] [phone]   [facility] [address], [place], [place] [phone] Insurance/Medicaid/sponsorship through [facility]  [facility] Families [address]                                    [place], [place] [phone] Therapy/tele-psych/case  [facility] [address].   [place], [place] [phone]) [phone]    Dr. Lolly MustacheArfeen  [phone](336) [phone]   [facility]  [employer] [facility] 1) [address], [place] 2) [address], [place] 3)  [address], [place] [phone]) [phone](336) [phone]  [phone](336) [phone]   [facility] Child Abuse Hotline [phone]) [phone] or [phone](336) [phone] (After Hours)
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
## 2014-08-12 ENCOUNTER — Other Ambulatory Visit (HOSPITAL_COMMUNITY)
Admission: RE | Admit: 2014-08-12 | Discharge: 2014-08-12 | Disposition: A | Discharge status: Home / Self Care | Payer: Self-pay | Source: Ambulatory Visit | Attending: Family Medicine | Admitting: Family Medicine

## 2014-08-12 ENCOUNTER — Emergency Department (INDEPENDENT_AMBULATORY_CARE_PROVIDER_SITE_OTHER): Payer: Self-pay

## 2014-08-12 ENCOUNTER — Encounter (HOSPITAL_COMMUNITY): Payer: Self-pay | Admitting: Emergency Medicine

## 2014-08-12 ENCOUNTER — Emergency Department (INDEPENDENT_AMBULATORY_CARE_PROVIDER_SITE_OTHER)
Admission: EM | Admit: 2014-08-12 | Discharge: 2014-08-12 | Disposition: A | Discharge status: Home / Self Care | Payer: Self-pay | Source: Home / Self Care | Attending: Family Medicine | Admitting: Family Medicine

## 2014-08-12 DIAGNOSIS — Z113 Encounter for screening for infections with a predominantly sexual mode of transmission: Secondary | ICD-10-CM | POA: Insufficient documentation

## 2014-08-12 DIAGNOSIS — S92212A Displaced fracture of cuboid bone of left foot, initial encounter for closed fracture: Secondary | ICD-10-CM
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
## 2014-08-12 NOTE — Discharge Instructions (Signed)
Thank you for coming in today. Do not put weight on the foot.  Call your old employer tomorrow and asked to be sent to the [employer] doctor. You have a cuboid fracture in your foot.  If they give you push back call a workers Naval architectcompensation lawyer and especially your case. Most lawyers will be happy to give you a free consultation.  Come back as needed  Cuneiform Fracture, Simple Cuneiform Fracture (simple) is a break (fracture) of one of your cuneiform bones. This is one of the bones located in the middle of your foot. When fractures are small and not out of place, they may be treated conservatively. This means that only a cast is needed for treatment. At first, no walking may be allowed, but following a period of time, your caregiver may allow you to have a walking cast. DIAGNOSIS  The diagnosis of a fractured cuneiform is made by X-ray. X-rays may be required before and after the injury is put into a splint or cast. HOME CARE INSTRUCTIONS   Only take over-the-counter or prescription medicines for pain, discomfort or fever as directed by your caregiver.  If you have a splint held on with an elastic wrap and your foot or toes become numb or cold and blue, loosen the wrap and reapply more loosely. See your caregiver if there is no relief.  Use your injured foot as directed. Do not drive a vehicle until your caregiver specifically tells you it is safe to do so.  WARNING: See your caregiver as directed. It is important to keep all follow-up appointments in order to provide the best opportunity for healing and to avoid disability and chronic pain. SEEK IMMEDIATE MEDICAL CARE IF:   There is swelling or increasing pain in foot.  You begin to lose feeling in your foot or toes, or develop swelling of the foot or toes.  The foot or toes on the injured side are blue or cold.  You develop pain, which is not controlled by the medications. MAKE SURE YOU:   Understand these  instructions.  Will watch your condition.  Will get help right away if you are not doing well or get worse. Document Released: 11/14/2001 Document Revised: 05/17/2011 Document Reviewed: 09/28/2007 [facility] Patient Information 2015 [employer] [place]. This information is not intended to replace advice given to you by your health care provider. Make sure you discuss any questions you have with your health care provider.
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
## 2014-08-12 NOTE — ED Notes (Signed)
Call back number verified.  

## 2014-08-13 LAB — RPR: RPR Ser Ql: NONREACTIVE

## 2014-08-13 LAB — HIV ANTIBODY (ROUTINE TESTING W REFLEX): HIV SCREEN 4TH GENERATION: NONREACTIVE

## 2014-08-13 LAB — URINE CYTOLOGY ANCILLARY ONLY
Chlamydia: NEGATIVE
Neisseria Gonorrhea: NEGATIVE
TRICH (WINDOWPATH): NEGATIVE

## 2014-08-13 NOTE — ED Notes (Signed)
Final reports of STD, HIV screenings negative

## 2014-08-20 NOTE — ED Notes (Signed)
Pt's chart was accessed by this Charge RN to reprint work note.

## 2014-08-22 ENCOUNTER — Encounter (HOSPITAL_COMMUNITY): Payer: Self-pay | Admitting: Emergency Medicine

## 2014-08-22 ENCOUNTER — Telehealth (HOSPITAL_COMMUNITY): Payer: Self-pay | Admitting: Family Medicine

## 2014-08-22 ENCOUNTER — Emergency Department (HOSPITAL_COMMUNITY)
Admission: EM | Admit: 2014-08-22 | Discharge: 2014-08-22 | Disposition: A | Discharge status: Home / Self Care | Payer: Self-pay | Attending: Emergency Medicine | Admitting: Emergency Medicine

## 2014-08-22 DIAGNOSIS — Z720 Tobacco use: Secondary | ICD-10-CM | POA: Insufficient documentation

## 2014-08-22 DIAGNOSIS — Z791 Long term (current) use of non-steroidal anti-inflammatories (NSAID): Secondary | ICD-10-CM | POA: Insufficient documentation

## 2014-08-22 DIAGNOSIS — S99922D Unspecified injury of left foot, subsequent encounter: Secondary | ICD-10-CM | POA: Insufficient documentation

## 2014-08-22 DIAGNOSIS — Z021 Encounter for pre-employment examination: Secondary | ICD-10-CM | POA: Insufficient documentation

## 2014-08-22 DIAGNOSIS — Z79899 Other long term (current) drug therapy: Secondary | ICD-10-CM | POA: Insufficient documentation

## 2014-08-22 DIAGNOSIS — F259 Schizoaffective disorder, unspecified: Secondary | ICD-10-CM | POA: Insufficient documentation

## 2014-08-22 DIAGNOSIS — X58XXXD Exposure to other specified factors, subsequent encounter: Secondary | ICD-10-CM | POA: Insufficient documentation

## 2014-08-22 DIAGNOSIS — Z8781 Personal history of (healed) traumatic fracture: Secondary | ICD-10-CM | POA: Insufficient documentation

## 2014-08-22 NOTE — Discharge Instructions (Signed)
1. Medications: usual home medications 2. Treatment: rest, drink plenty of fluids, continue to be non weight bearing with crutches and use post-op shoe until cleared by orthopedics  3. Follow Up: Please followup [facility] for further evaluation and clearance to return to work
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
## 2014-08-22 NOTE — ED Notes (Signed)
Spoke with Case Management.  An appointment was made with [facility] for tomorrow at 12:15.  Pt is aware of appointment time and states he can make the appointment tomorrow.

## 2014-08-22 NOTE — ED Notes (Signed)
Patient returns to clinic today asking for return to work form. 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 He requests to return to work on a job that requires prolonged standing and heavy lifting. I explained that I cannot fill this form out as I think he has a fracture in his foot and prolonged standing is not in his best interest. He may benefit from further referral to sports medicine for further evaluation and management.  Rodolph Bong, MD 08/22/14 [phone]
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
## 2014-08-23 ENCOUNTER — Ambulatory Visit: Payer: Self-pay | Attending: Family Medicine | Admitting: Family Medicine

## 2014-08-23 ENCOUNTER — Encounter: Payer: Self-pay | Admitting: Family Medicine

## 2014-08-23 VITALS — BP 106/69 | HR 69 | Temp 98.0°F | Ht 66.0 in | Wt 159.0 lb

## 2014-08-23 DIAGNOSIS — S92213A Displaced fracture of cuboid bone of unspecified foot, initial encounter for closed fracture: Secondary | ICD-10-CM | POA: Insufficient documentation

## 2014-08-23 DIAGNOSIS — S92212G Displaced fracture of cuboid bone of left foot, subsequent encounter for fracture with delayed healing: Secondary | ICD-10-CM
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
## 2014-08-28 ENCOUNTER — Ambulatory Visit: Payer: Self-pay | Attending: Internal Medicine

## 2014-12-17 ENCOUNTER — Emergency Department (HOSPITAL_COMMUNITY)
Admission: EM | Admit: 2014-12-17 | Discharge: 2014-12-17 | Disposition: A | Discharge status: Home / Self Care | Payer: Self-pay | Attending: Emergency Medicine | Admitting: Emergency Medicine

## 2014-12-17 ENCOUNTER — Encounter (HOSPITAL_COMMUNITY): Payer: Self-pay | Admitting: *Deleted

## 2014-12-17 DIAGNOSIS — J45909 Unspecified asthma, uncomplicated: Secondary | ICD-10-CM | POA: Insufficient documentation

## 2014-12-17 DIAGNOSIS — Z8659 Personal history of other mental and behavioral disorders: Secondary | ICD-10-CM | POA: Insufficient documentation

## 2014-12-17 DIAGNOSIS — Z720 Tobacco use: Secondary | ICD-10-CM | POA: Insufficient documentation

## 2014-12-17 DIAGNOSIS — J069 Acute upper respiratory infection, unspecified: Secondary | ICD-10-CM | POA: Insufficient documentation

## 2014-12-17 DIAGNOSIS — Z87828 Personal history of other (healed) physical injury and trauma: Secondary | ICD-10-CM | POA: Insufficient documentation

## 2014-12-17 MED ORDER — GUAIFENESIN 100 MG/5ML PO LIQD: 100.0000 mg | ORAL | Status: DC | PRN

## 2014-12-17 MED ORDER — PSEUDOEPHEDRINE HCL 30 MG PO TABS: 30.0000 mg | ORAL_TABLET | Freq: Four times a day (QID) | ORAL | Status: DC | PRN

## 2014-12-17 NOTE — Discharge Instructions (Signed)
Cool Mist Vaporizers °Vaporizers may help relieve the symptoms of a cough and cold. They add moisture to the air, which helps mucus to become thinner and less sticky. This makes it easier to breathe and cough up secretions. Cool mist vaporizers do not cause serious burns like hot mist vaporizers, which may also be called steamers or humidifiers. Vaporizers have not been proven to help with colds. You should not use a vaporizer if you are allergic to mold. °HOME CARE INSTRUCTIONS °· Follow the package instructions for the vaporizer. °· Do not use anything other than distilled water in the vaporizer. °· Do not run the vaporizer all of the time. This can cause mold or bacteria to grow in the vaporizer. °· Clean the vaporizer after each time it is used. °· Clean and dry the vaporizer well before storing it. °· Stop using the vaporizer if worsening respiratory symptoms develop. °  °This information is not intended to replace advice given to you by your health care provider. Make sure you discuss any questions you have with your health care provider. °  °Document Released: 11/20/2003 Document Revised: 02/27/2013 Document Reviewed: 07/12/2012 °Elsevier Interactive Patient Education ©2016 Else[employer] Inc[employer] ° °Cough, Adult °A cough helps to clear your throat and lungs. A cough may last only 2-3 weeks (acute), or it may last longer than 8 weeks (chronic). Many different things can cause a cough. A cough may be a sign of an illness or another medical condition. °HOME CARE °· Pay attention to any changes in your cough. °· Take medicines only as told by your doctor. °· If you were prescribed an antibiotic medicine, take it as told by your doctor. Do not stop taking it even if you start to feel better. °· Talk with your doctor before you try using a cough medicine. °· Drink enough fluid to keep your pee (urine) clear or pale yellow. °· If the air is dry, use a cold steam vaporizer or humidifier in your home. °· Stay away from things  that make you cough at work or at home. °· If your cough is worse at night, try using extra pillows to raise your head up higher while you sleep. °· Do not smoke, and try not to be around smoke. If you need help quitting, ask your doctor. °· Do not have caffeine. °· Do not drink alcohol. °· Rest as needed. °GET HELP IF: °· You have new problems (symptoms). °· You cough up yellow fluid (pus). °· Your cough does not get better after 2-3 weeks, or your cough gets worse. °· Medicine does not help your cough and you are not sleeping well. °· You have pain that gets worse or pain that is not helped with medicine. °· You have a fever. °· You are losing weight and you do not know why. °· You have night sweats. °GET HELP RIGHT AWAY IF: °· You cough up blood. °· You have trouble breathing. °· Your heartbeat is very fast. °  °This information is not intended to replace advice given to you by your health care provider. Make sure you discuss any questions you have with your health care provider. °  °Document Released: 11/05/2010 Document Revised: 11/13/2014 Document Reviewed: 05/01/2014 °Elsevier Interactive Patient Education ©2016 [employer] ° °Upper Respiratory Infection, Adult °Most upper respiratory infections (URIs) are a viral infection of the air passages leading to the lungs. A URI affects the nose, throat, and upper air passages. The most common type of URI is nasopharyngitis and   is typically referred to as "the common cold." °URIs run their course and usually go away on their own. Most of the time, a URI does not require medical attention, but sometimes a bacterial infection in the upper airways can follow a viral infection. This is called a secondary infection. Sinus and middle ear infections are common types of secondary upper respiratory infections. °Bacterial pneumonia can also complicate a URI. A URI can worsen asthma and chronic obstructive pulmonary disease (COPD). Sometimes, these complications can require  emergency medical care and may be life threatening.  °CAUSES °Almost all URIs are caused by viruses. A virus is a type of germ and can spread from one person to another.  °RISKS FACTORS °You may be at risk for a URI if:  °· You smoke.   °· You have chronic heart or lung disease. °· You have a weakened defense (immune) system.   °· You are very young or very old.   °· You have nasal allergies or asthma. °· You work in crowded or poorly ventilated areas. °· You work in health care facilities or schools. °SIGNS AND SYMPTOMS  °Symptoms typically develop 2-3 days after you come in contact with a cold virus. Most viral URIs last 7-10 days. However, viral URIs from the influenza virus (flu virus) can last 14-18 days and are typically more severe. Symptoms may include:  °· Runny or stuffy (congested) nose.   °· Sneezing.   °· Cough.   °· Sore throat.   °· Headache.   °· Fatigue.   °· Fever.   °· Loss of appetite.   °· Pain in your forehead, behind your eyes, and over your cheekbones (sinus pain). °· Muscle aches.   °DIAGNOSIS  °Your health care provider may diagnose a URI by: °· Physical exam. °· Tests to check that your symptoms are not due to another condition such as: °¨ Strep throat. °¨ Sinusitis. °¨ Pneumonia. °¨ Asthma. °TREATMENT  °A URI goes away on its own with time. It cannot be cured with medicines, but medicines may be prescribed or recommended to relieve symptoms. Medicines may help: °· Reduce your fever. °· Reduce your cough. °· Relieve nasal congestion. °HOME CARE INSTRUCTIONS  °· Take medicines only as directed by your health care provider.   °· Gargle warm saltwater or take cough drops to comfort your throat as directed by your health care provider. °· Use a warm mist humidifier or inhale steam from a shower to increase air moisture. This may make it easier to breathe. °· Drink enough fluid to keep your urine clear or pale yellow.   °· Eat soups and other clear broths and maintain good nutrition.   °· Rest  as needed.   °· Return to work when your temperature has returned to normal or as your health care provider advises. You may need to stay home longer to avoid infecting others. You can also use a face mask and careful hand washing to prevent spread of the virus. °· Increase the usage of your inhaler if you have asthma.   °· Do not use any tobacco products, including cigarettes, chewing tobacco, or electronic cigarettes. If you need help quitting, ask your health care provider. °PREVENTION  °The best way to protect yourself from getting a cold is to practice good hygiene.  °· Avoid oral or hand contact with people with cold symptoms.   °· Wash your hands often if contact occurs.   °There is no clear evidence that vitamin C, vitamin E, echinacea, or exercise reduces the chance of developing a cold. However, it is always recommended to get plenty   of rest, exercise, and practice good nutrition.  °SEEK MEDICAL CARE IF:  °· You are getting worse rather than better.   °· Your symptoms are not controlled by medicine.   °· You have chills. °· You have worsening shortness of breath. °· You have brown or red mucus. °· You have yellow or brown nasal discharge. °· You have pain in your face, especially when you bend forward. °· You have a fever. °· You have swollen neck glands. °· You have pain while swallowing. °· You have white areas in the back of your throat. °SEEK IMMEDIATE MEDICAL CARE IF:  °· You have severe or persistent: °¨ Headache. °¨ Ear pain. °¨ Sinus pain. °¨ Chest pain. °· You have chronic lung disease and any of the following: °¨ Wheezing. °¨ Prolonged cough. °¨ Coughing up blood. °¨ A change in your usual mucus. °· You have a stiff neck. °· You have changes in your: °¨ Vision. °¨ Hearing. °¨ Thinking. °¨ Mood. °MAKE SURE YOU:  °· Understand these instructions. °· Will watch your condition. °· Will get help right away if you are not doing well or get worse. °  °This information is not intended to replace advice  given to you by your health care provider. Make sure you discuss any questions you have with your health care provider. °  °Document Released: 08/18/2000 Document Revised: 07/09/2014 Document Reviewed: 05/30/2013 °Elsevier Interactive Patient Education ©2016 [employer]vier Inc. ° °

## 2014-12-17 NOTE — ED Notes (Signed)
Reviewed discharge instructions and medications with patient, who verbalized understanding.  Provided patient with work note to return on Thursday, 12/19/2014 per PA.

## 2014-12-17 NOTE — ED Notes (Signed)
Patient presents with cold symptoms.  Denies fever +cough
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
## 2014-12-25 ENCOUNTER — Emergency Department (HOSPITAL_COMMUNITY)
Admission: EM | Admit: 2014-12-25 | Discharge: 2014-12-25 | Disposition: A | Discharge status: Home / Self Care | Payer: Self-pay | Attending: Emergency Medicine | Admitting: Emergency Medicine

## 2014-12-25 ENCOUNTER — Encounter (HOSPITAL_COMMUNITY): Payer: Self-pay

## 2014-12-25 DIAGNOSIS — J45901 Unspecified asthma with (acute) exacerbation: Secondary | ICD-10-CM | POA: Insufficient documentation

## 2014-12-25 DIAGNOSIS — F259 Schizoaffective disorder, unspecified: Secondary | ICD-10-CM | POA: Insufficient documentation

## 2014-12-25 DIAGNOSIS — J40 Bronchitis, not specified as acute or chronic: Secondary | ICD-10-CM

## 2014-12-25 DIAGNOSIS — Z87828 Personal history of other (healed) physical injury and trauma: Secondary | ICD-10-CM | POA: Insufficient documentation

## 2014-12-25 DIAGNOSIS — Z720 Tobacco use: Secondary | ICD-10-CM | POA: Insufficient documentation

## 2014-12-25 DIAGNOSIS — Z79899 Other long term (current) drug therapy: Secondary | ICD-10-CM | POA: Insufficient documentation

## 2014-12-25 MED ORDER — AZITHROMYCIN 250 MG PO TABS: ORAL_TABLET | ORAL | Status: DC

## 2014-12-25 MED ORDER — PREDNISONE 20 MG PO TABS: 40.0000 mg | ORAL_TABLET | Freq: Every day | ORAL | Status: DC

## 2014-12-25 NOTE — Discharge Instructions (Signed)
Please read and follow all provided instructions.  Your diagnoses today include:  1. Bronchitis     Tests performed today include:  Vital signs. See below for your results today.   Medications prescribed:   Prednisone - steroid medicine   It is best to take this medication in the morning to prevent sleeping problems. If you are diabetic, monitor your blood sugar closely and stop taking Prednisone if blood sugar is over 300. Take with food to prevent stomach upset.    Azithromycin - antibiotic for respiratory infection  You have been prescribed an antibiotic medicine: take the entire course of medicine even if you are feeling better. Stopping early can cause the antibiotic not to work.  Take any prescribed medications only as directed.  Home care instructions:  Follow any educational materials contained in this packet.  Follow-up instructions: Please follow-up with your primary care provider in the next 3 days for further evaluation of your symptoms and a recheck if you are not feeling better.   Return instructions:   Please return to the Emergency Department if you experience worsening symptoms.  Please return with worsening wheezing, shortness of breath, or difficulty breathing.  Return with persistent fever above 101F.   Please return if you have any other emergent concerns.  Additional Information:  Your vital signs today were: BP 111/80 mmHg   Pulse 88   Temp(Src) 98.1 F (36.7 C) (Oral)   Resp 20   SpO2 98% If your blood pressure (BP) was elevated above 135/85 this visit, please have this repeated by your doctor within one month. --------------

## 2014-12-25 NOTE — ED Notes (Signed)
Pt c/o nasal congestion and cough x 13 days.  Denies pain.  Pt reports that he would like something for the cough and a work note.  Pt reports taking OTC medication w/o relief.  [facility] for same, but did not get prescription filled.
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
## 2015-02-02 ENCOUNTER — Emergency Department (HOSPITAL_COMMUNITY)
Admission: EM | Admit: 2015-02-02 | Discharge: 2015-02-02 | Disposition: A | Discharge status: Home / Self Care | Payer: Self-pay | Attending: Emergency Medicine | Admitting: Emergency Medicine

## 2015-02-02 ENCOUNTER — Encounter (HOSPITAL_COMMUNITY): Payer: Self-pay | Admitting: *Deleted

## 2015-02-02 DIAGNOSIS — S41151A Open bite of right upper arm, initial encounter: Secondary | ICD-10-CM | POA: Insufficient documentation

## 2015-02-02 DIAGNOSIS — Z7952 Long term (current) use of systemic steroids: Secondary | ICD-10-CM | POA: Insufficient documentation

## 2015-02-02 DIAGNOSIS — Y9389 Activity, other specified: Secondary | ICD-10-CM | POA: Insufficient documentation

## 2015-02-02 DIAGNOSIS — Y998 Other external cause status: Secondary | ICD-10-CM | POA: Insufficient documentation

## 2015-02-02 DIAGNOSIS — Z79899 Other long term (current) drug therapy: Secondary | ICD-10-CM | POA: Insufficient documentation

## 2015-02-02 DIAGNOSIS — Z8659 Personal history of other mental and behavioral disorders: Secondary | ICD-10-CM | POA: Insufficient documentation

## 2015-02-02 DIAGNOSIS — W503XXA Accidental bite by another person, initial encounter: Secondary | ICD-10-CM

## 2015-02-02 DIAGNOSIS — Y92099 Unspecified place in other non-institutional residence as the place of occurrence of the external cause: Secondary | ICD-10-CM | POA: Insufficient documentation

## 2015-02-02 DIAGNOSIS — F17200 Nicotine dependence, unspecified, uncomplicated: Secondary | ICD-10-CM | POA: Insufficient documentation

## 2015-02-02 DIAGNOSIS — S1195XA Open bite of unspecified part of neck, initial encounter: Secondary | ICD-10-CM | POA: Insufficient documentation

## 2015-02-02 MED ORDER — TETANUS-DIPHTH-ACELL PERTUSSIS 5-2.5-18.5 LF-MCG/0.5 IM SUSP: 0.5000 mL | Freq: Once | INTRAMUSCULAR | Status: DC

## 2015-02-02 MED ORDER — IBUPROFEN 800 MG PO TABS: 800.0000 mg | ORAL_TABLET | Freq: Three times a day (TID) | ORAL | Status: DC

## 2015-02-02 MED ORDER — AMOXICILLIN-POT CLAVULANATE 875-125 MG PO TABS: 1.0000 | ORAL_TABLET | Freq: Two times a day (BID) | ORAL | Status: DC

## 2015-02-02 MED ORDER — IBUPROFEN 400 MG PO TABS
800.0000 mg | ORAL_TABLET | Freq: Once | ORAL | Status: AC
  Administered 2015-02-02: 800 mg via ORAL
  Filled 2015-02-02: qty 2

## 2015-02-02 MED ORDER — AMOXICILLIN-POT CLAVULANATE 875-125 MG PO TABS
1.0000 | ORAL_TABLET | Freq: Once | ORAL | Status: AC
  Administered 2015-02-02: 1 via ORAL
  Filled 2015-02-02: qty 1
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
## 2015-03-03 ENCOUNTER — Emergency Department (HOSPITAL_COMMUNITY)
Admission: EM | Admit: 2015-03-03 | Discharge: 2015-03-03 | Disposition: A | Discharge status: Home / Self Care | Payer: Self-pay | Attending: Emergency Medicine | Admitting: Emergency Medicine

## 2015-03-03 ENCOUNTER — Encounter (HOSPITAL_COMMUNITY): Payer: Self-pay

## 2015-03-03 DIAGNOSIS — Z79899 Other long term (current) drug therapy: Secondary | ICD-10-CM | POA: Insufficient documentation

## 2015-03-03 DIAGNOSIS — Z87828 Personal history of other (healed) physical injury and trauma: Secondary | ICD-10-CM | POA: Insufficient documentation

## 2015-03-03 DIAGNOSIS — J029 Acute pharyngitis, unspecified: Secondary | ICD-10-CM | POA: Insufficient documentation

## 2015-03-03 DIAGNOSIS — F17200 Nicotine dependence, unspecified, uncomplicated: Secondary | ICD-10-CM | POA: Insufficient documentation

## 2015-03-03 DIAGNOSIS — H9203 Otalgia, bilateral: Secondary | ICD-10-CM | POA: Insufficient documentation

## 2015-03-03 DIAGNOSIS — Z8659 Personal history of other mental and behavioral disorders: Secondary | ICD-10-CM | POA: Insufficient documentation

## 2015-03-03 MED ORDER — DEXAMETHASONE SODIUM PHOSPHATE 10 MG/ML IJ SOLN
10.0000 mg | Freq: Once | INTRAMUSCULAR | Status: AC
  Administered 2015-03-03: 10 mg via INTRAVENOUS
  Filled 2015-03-03: qty 1

## 2015-03-03 MED ORDER — SODIUM CHLORIDE 0.9 % IV BOLUS (SEPSIS)
1000.0000 mL | Freq: Once | INTRAVENOUS | Status: AC
  Administered 2015-03-03: 1000 mL via INTRAVENOUS

## 2015-03-03 MED ORDER — MORPHINE SULFATE (PF) 2 MG/ML IV SOLN
2.0000 mg | Freq: Once | INTRAVENOUS | Status: AC
  Administered 2015-03-03: 2 mg via INTRAVENOUS
  Filled 2015-03-03: qty 1

## 2015-03-03 MED ORDER — ONDANSETRON HCL 4 MG/2ML IJ SOLN
4.0000 mg | Freq: Once | INTRAMUSCULAR | Status: AC
  Administered 2015-03-03: 4 mg via INTRAVENOUS
  Filled 2015-03-03: qty 2

## 2015-03-03 MED ORDER — AMOXICILLIN 250 MG/5ML PO SUSR: 500.0000 mg | Freq: Two times a day (BID) | ORAL | Status: DC

## 2015-03-03 NOTE — Discharge Instructions (Signed)
Take antibiotic until completion. Continue to stay well-hydrated. Gargle warm salt water and spit it out. Continue to alternate between Tylenol and ibuprofen for pain. May consider over-the-counter Benadryl for additional relief. Follow up with your primary care doctor in 5-7 days for recheck of ongoing symptoms and return to emergency department if any new or worsening of symptoms. Use attached resource guide if you need help finding a primary provider.    Emergency Department Resource Guide 1) Find a Doctor and Pay Out of Pocket Although you won't have to find out who is covered by your insurance plan, it is a good idea to ask around and get recommendations. You will then need to call the office and see if the doctor you have chosen will accept you as a new patient and what types of options they offer for patients who are self-pay. Some doctors offer discounts or will set up payment plans for their patients who do not have insurance, but you will need to ask so you aren't surprised when you get to your appointment.  2) Contact Your Local Health Department Not all health departments have doctors that can see patients for sick visits, but many do, so it is worth a call to see if yours does. If you don't know where your local health department is, you can check in your phone book. The CDC also has a tool to help you locate your state's health department, and many state websites also have listings of all of their local health departments.  3) Find a Walk-in Clinic If your illness is not likely to be very severe or complicated, you may want to try a walk in clinic. These are popping up all over the country in pharmacies, drugstores, and shopping centers. They're usually staffed by nurse practitioners or physician assistants that have been trained to treat common illnesses and complaints. They're usually fairly quick and inexpensive. However, if you have serious medical issues or chronic medical problems,  these are probably not your best option.  No Primary Care Doctor: - Call Health Connect at  [phone] - they can help you locate a primary care doctor that  accepts your insurance, provides certain services, etc. - Physician Referral Service- [phone]  Chronic Pain Problems: Organization         Address  Phone   Notes  [facility]  [phone] Patients need to be referred by their primary care doctor.   Medication Assistance: Organization         Address  Phone   Notes  [facility] [address] [place], [place] [address] [phone] --Must be a resident of [place] [facility] [place] -- Must have NO insurance coverage whatsoever (no Medicaid/ Medicare, etc.) -- The pt. MUST have a primary care doctor that directs their care regularly and follows them in the community   [facility]  [phone]   [employer]  [phone]    Agencies that provide inexpensive medical care: Organization         Address  Phone   Notes  [facility]  [phone]   [facility]    [phone]   [facility] [address] [place], [place] [address] [phone]   [facility] [address], [place] [phone]   [facility]    [phone]   [facility]    [phone]   [facility]  and [address], [place] Phone:  [phone], Fax:  [phone] Hours of Operation:  9 am - 6 pm, M-F.  Also accepts Medicaid/Medicare and self-pay.  [facility] for Center [place], [address], [place] Phone: [phone], Fax: [phone]. Hours of Operation:  8:30 am - 5:30 pm, M-F.  Also accepts Medicaid and self-pay.  [facility] [place] [address], [place] Phone: [phone]   [place], [place], [place] [phone], Ext. [phone] Mondays &  Thursdays: 7-9 AM.  First 15 patients are seen on a first come, first serve basis.    [place] Providers:  Organization         Address  Phone   Notes  [facility] [address], [place] [phone] Also accepts self-pay patients.  [facility] [id number] [place], [place]  [phone]   [place], [address], [place] [phone]   [facility] [address], [place] [phone]   Lucianne Lei [address], [place]   [phone] Only accepts [place] Access [place] patients after they have their name applied to their card.   Self-Pay (no insurance) in [facility]:  Organization         Address  Phone   Notes  Sickle Cell Patients, [facility] [place] [phone]   [facility] [place] [phone]   [facility] [place]  [place], [place], [place] [phone]   [facility]/Dr. Osei-Bonsu  [address] [place] or [address], [place] [phone] Phone number for both [place] and [place] locations is the same.  [facility] [address], [place] [phone]   [facility] [address], [place] or [address] [phone]   [facility] [address], [place] [phone], phone; [phone], fax Sees patients 1st and 3rd Saturday of every month.  Must not qualify for public or private insurance (i.e. Medicaid, Medicare, [place] Health Choice, Veterans' Benefits)  Household income should be no more than 200% of the poverty level The clinic cannot treat you if you are pregnant or think you are pregnant  Sexually transmitted diseases are not treated at the  clinic.    Dental Care: Organization         Address  Phone  Notes  [facility] [phone] Accepts children up to age 63 who are enrolled in [place] or [place]; pregnant women with a Medicaid card; and children who have applied for Medicaid or [place] Health Choice, but were declined, whose parents can pay a reduced fee at time of service.  [facility]  [address], [place] [phone] Accepts children up to age 43 who are enrolled in [place] or [place]; pregnant women with a Medicaid card; and children who have applied for Medicaid or [place] Health Choice, but were declined, whose parents can pay a reduced fee at time of service.  [facility]  [address], [place] [phone] Patients are seen by appointment only. Walk-ins are not accepted. [facility] will see patients 18 years of age and older. °Monday - Tuesday (8am-5pm) °Most Wednesdays (8:30-5pm) °$30 per visit, cash only  °[facility] ° [address], [place] [phone] Patients are seen by appointment only. Walk-ins are not accepted. [facility] will see patients 18 years of age and older. °One Wednesday Evening (Monthly: Volunteer Based).  $30 per visit, cash only  °[facility]  [phone] for adults; Children under age 4, call Graduate Pediatric Dentistry at [phone]. Children aged 4-14, please call [phone] to request a pediatric application. ° Dental services are provided in all areas of dental care including fillings, crowns and bridges, complete and partial dentures, implants, gum treatment, root canals, and extractions. Preventive care is also provided. Treatment is provided to both adults and children. °Patients are selected via a lottery and there is often a  waiting list. °  °[facility] [address], °[place] ° ([phone]) [phone] [email or web address] °  °[facility] [address], [place], [place] [phone], Ext. [phone] Second and Fourth Thursday of each month, opens at 6:30 AM; Clinic ends at 9 AM.  Patients are seen on a first-come first-served basis, and a limited number are seen during each clinic.  ° °[facility] ° [address], [place], [place] [phone]   Eligibility Requirements °You must have lived in [place], [place], or [place] counties for at least the last three months. °  You cannot be eligible for state or federal sponsored healthcare insurance, including Veterans Administration, Medicaid, or Medicare. °  You generally cannot be eligible for healthcare insurance through your employer.  °  How to apply: °Eligibility screenings are held every Tuesday and Wednesday afternoon from 1:00 pm until 4:00 pm. You do not need an appointment for the interview!  °[facility] [address], [place], [place] [phone]   °[facility]  336-342-8273   °[facility]  336-703-3100   °[facility]  336-570-6415   ° °Behavioral Health Resources in the Community: °Intensive Outpatient Programs °Organization         Address  Phone  Notes  °[facility] [address], [place], [place] [phone]   °[facility] [address], [place], [place] [phone]   °[facility] [address], [place], [place] ° 336-882-2125   °[facility] [address],  °[place], [place] 1-800-853-5163 or 336-641-4981   °Substance Abuse Resources °Organization         Address  Phone  Notes  °[facility]  336-882-2125   °[facility]  336-784-9470   °[facility]  336-285-9073   °[facility]  336-845-3988   °Residential & Outpatient Substance Abuse  Program  1-800-659-3381   °Psychological Services °Organization         Address  Phone  Notes  °[facility]  336- 832-9600   °[facility]  336- 378-7881   °[facility] [address], [place] 1-800-853-5163 or 336-641-4981   ° °Mobile Crisis Teams °Organization         Address  Phone  Notes  °[facility], Mobile Crisis Care Unit  1-877-626-1772   °Assertive °[facility] ° [address] [place], [place] 336-834-9664   °Sharon DeEsch   [address] °[place] 336-554-5454   ° °Self-Help/Support Groups °Organization         Address  Phone             Notes  °[facility] [place] - variety of support groups  336- 373-1402 Call for more information  °Narcotics Anonymous (NA), [facility] [address], °[place]  2 meetings at this location  ° °Residential Treatment Programs °Organization         Address  Phone  Notes  °[facility] [address],    °[place]  1-866-801-8205   °[facility] ° [address], [place], [place] [phone]   °[facility] [address], [place] 336-845-3988 Admissions: 8am-3pm M-F  °[facility] [address]    °[place], [place] 336-841-1104   °[facility] [address], [place], [place] 336-379-7146   °[facility] [address]  °[place], [place] 336-285-9073   °[facility] - Intensive Outpatient [address], [place], [place] 336-852-3033   °[facility] [address]  °[place], [place] 1-877-615-2722 or 336-784-9470   °[facility] ([facility]) [address] [place], [place] 336-227-7417 Accepts Medicaid  °[facility] [address]  ° [place] 1-800-659-3381 Substance Abuse/Addiction Treatment  ° °[place] [facility] Resources °Organization          Address  Phone  Notes  °[facility]  (888) 581-9988   °Julie Brannon, PhD [address] [place], [place]   (336) 349-5553 or (336) 951-0000   °[facility]   [address] °[place], [place] (336) 349-4454   °[facility] [address], [place], [place] (336) 342-8316 Insurance/Medicaid/sponsorship through [facility]  °[facility] [address]                                    [place], [employer] [place] (336) 342-8316 Therapy/tele-psych/case  °[facility] [address]  ° [place], [place] (336) 349-2233    °Dr. Arfeen  (336) 349-4544   °[facility] [place]  [employer] [place] [facility] 1) [address], [place] °2) [address], [place] °3)  [address], [place] (336) 349-3220 °(336) 342-7768 ° °(336) 342-8140   °[place] Child Abuse Hotline (336) 342-1394 or (336) 342-3537 (After Hours)    ° ° ° °

## 2015-03-03 NOTE — ED Notes (Signed)
Per EMS- patient c/o sore throat and bilateral ear pain since yesterday. White patches present to the tonsils per EMS. Patient had Tylenol 1000 mg at 0300 today.
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
## 2015-03-03 NOTE — ED Notes (Signed)
Bed: WA17 Expected date:  Expected time:  Means of arrival:  Comments: abd pain-ems

## 2015-03-14 ENCOUNTER — Emergency Department (HOSPITAL_COMMUNITY)
Admission: EM | Admit: 2015-03-14 | Discharge: 2015-03-14 | Discharge status: Against Medical Advice | Payer: Self-pay | Attending: Emergency Medicine | Admitting: Emergency Medicine

## 2015-03-14 ENCOUNTER — Encounter (HOSPITAL_COMMUNITY): Payer: Self-pay | Admitting: *Deleted

## 2015-03-14 DIAGNOSIS — R0981 Nasal congestion: Secondary | ICD-10-CM | POA: Insufficient documentation

## 2015-03-14 DIAGNOSIS — F17200 Nicotine dependence, unspecified, uncomplicated: Secondary | ICD-10-CM | POA: Insufficient documentation

## 2015-03-14 DIAGNOSIS — R05 Cough: Secondary | ICD-10-CM | POA: Insufficient documentation

## 2015-03-14 DIAGNOSIS — M2550 Pain in unspecified joint: Secondary | ICD-10-CM | POA: Insufficient documentation

## 2015-03-14 NOTE — ED Notes (Signed)
Pt name called, no response 

## 2015-03-14 NOTE — ED Notes (Signed)
Pt name called twice, no answer. 

## 2015-03-14 NOTE — ED Notes (Signed)
Pt states that he was recently treated for strep and has finished his anbx; pt states that he has had cough and congestion for the last few days; pt reports nasal congestion; cough worse in the morning with greenish sputum; pt denies sore throat; pt also c/o joint pain

## 2015-09-02 ENCOUNTER — Encounter (HOSPITAL_COMMUNITY): Payer: Self-pay | Admitting: Emergency Medicine

## 2015-09-02 ENCOUNTER — Ambulatory Visit (HOSPITAL_COMMUNITY)
Admission: EM | Admit: 2015-09-02 | Discharge: 2015-09-02 | Disposition: A | Discharge status: Home / Self Care | Payer: Self-pay | Attending: Family Medicine | Admitting: Family Medicine

## 2015-09-02 DIAGNOSIS — M722 Plantar fascial fibromatosis: Secondary | ICD-10-CM

## 2015-09-02 MED ORDER — IBUPROFEN 800 MG PO TABS: 800.0000 mg | ORAL_TABLET | Freq: Three times a day (TID) | ORAL | Status: DC

## 2015-09-02 NOTE — ED Notes (Signed)
Pt has been suffering from calf pain that is now in his heels, bilaterally.  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  The pain is bad in the morning when he wakes up, and it gets worse at work as the day goes on and he is on his feet all day.
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
## 2016-05-29 ENCOUNTER — Emergency Department (HOSPITAL_COMMUNITY)
Admission: EM | Admit: 2016-05-29 | Discharge: 2016-05-30 | Disposition: A | Discharge status: Home / Self Care | Payer: Self-pay | Attending: Emergency Medicine | Admitting: Emergency Medicine

## 2016-05-29 ENCOUNTER — Encounter (HOSPITAL_COMMUNITY): Payer: Self-pay | Admitting: Emergency Medicine

## 2016-05-29 DIAGNOSIS — R45851 Suicidal ideations: Secondary | ICD-10-CM | POA: Insufficient documentation

## 2016-05-29 DIAGNOSIS — F4325 Adjustment disorder with mixed disturbance of emotions and conduct: Secondary | ICD-10-CM | POA: Diagnosis present

## 2016-05-29 DIAGNOSIS — F17200 Nicotine dependence, unspecified, uncomplicated: Secondary | ICD-10-CM | POA: Insufficient documentation

## 2016-05-29 DIAGNOSIS — J45909 Unspecified asthma, uncomplicated: Secondary | ICD-10-CM | POA: Insufficient documentation

## 2016-05-29 DIAGNOSIS — Z79899 Other long term (current) drug therapy: Secondary | ICD-10-CM | POA: Insufficient documentation

## 2016-05-29 LAB — COMPREHENSIVE METABOLIC PANEL
ALT: 19 U/L (ref 17–63)
ANION GAP: 8 (ref 5–15)
AST: 30 U/L (ref 15–41)
Albumin: 4 g/dL (ref 3.5–5.0)
Alkaline Phosphatase: 79 U/L (ref 38–126)
BUN: 17 mg/dL (ref 6–20)
CALCIUM: 9.6 mg/dL (ref 8.9–10.3)
CO2: 24 mmol/L (ref 22–32)
Chloride: 106 mmol/L (ref 101–111)
Creatinine, Ser: 1.15 mg/dL (ref 0.61–1.24)
Glucose, Bld: 136 mg/dL — ABNORMAL HIGH (ref 65–99)
Potassium: 3.6 mmol/L (ref 3.5–5.1)
SODIUM: 138 mmol/L (ref 135–145)
Total Bilirubin: 0.5 mg/dL (ref 0.3–1.2)
Total Protein: 8.3 g/dL — ABNORMAL HIGH (ref 6.5–8.1)

## 2016-05-29 LAB — ETHANOL

## 2016-05-29 LAB — CBC
HCT: 38.6 % — ABNORMAL LOW (ref 39.0–52.0)
Hemoglobin: 12.7 g/dL — ABNORMAL LOW (ref 13.0–17.0)
MCH: 21.2 pg — ABNORMAL LOW (ref 26.0–34.0)
MCHC: 32.9 g/dL (ref 30.0–36.0)
MCV: 64.5 fL — ABNORMAL LOW (ref 78.0–100.0)
PLATELETS: 284 10*3/uL (ref 150–400)
RBC: 5.98 MIL/uL — ABNORMAL HIGH (ref 4.22–5.81)
RDW: 15.4 % (ref 11.5–15.5)
WBC: 11 10*3/uL — ABNORMAL HIGH (ref 4.0–10.5)

## 2016-05-29 LAB — SALICYLATE LEVEL

## 2016-05-29 LAB — ACETAMINOPHEN LEVEL

## 2016-05-29 NOTE — ED Notes (Signed)
Bed: WA27 Expected date:  Expected time:  Means of arrival:  Comments: [employer] IVC 
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
## 2016-05-30 DIAGNOSIS — F4325 Adjustment disorder with mixed disturbance of emotions and conduct: Secondary | ICD-10-CM | POA: Diagnosis present

## 2016-05-30 LAB — RAPID URINE DRUG SCREEN, HOSP PERFORMED
Amphetamines: NOT DETECTED
BENZODIAZEPINES: NOT DETECTED
Barbiturates: NOT DETECTED
Cocaine: NOT DETECTED
OPIATES: NOT DETECTED
Tetrahydrocannabinol: POSITIVE — AB

## 2016-05-30 MED ORDER — ACETAMINOPHEN 325 MG PO TABS
650.0000 mg | ORAL_TABLET | ORAL | Status: DC | PRN
  Administered 2016-05-30: 650 mg via ORAL
  Filled 2016-05-30: qty 2

## 2016-05-30 MED ORDER — IBUPROFEN 200 MG PO TABS: 600.0000 mg | ORAL_TABLET | Freq: Three times a day (TID) | ORAL | Status: DC | PRN

## 2016-05-30 MED ORDER — ZOLPIDEM TARTRATE 5 MG PO TABS: 5.0000 mg | ORAL_TABLET | Freq: Every evening | ORAL | Status: DC | PRN

## 2016-05-30 MED ORDER — ONDANSETRON HCL 4 MG PO TABS: 4.0000 mg | ORAL_TABLET | Freq: Three times a day (TID) | ORAL | Status: DC | PRN
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
## 2016-05-30 NOTE — ED Notes (Signed)
Pt requesting g/f Oletha Cruelierra Clark to take position of wallet with all contents inside; which is with pt property. Pt signed wallet out to g/f.
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
## 2016-05-30 NOTE — ED Notes (Signed)
TTS at bedside. 

## 2016-05-30 NOTE — ED Notes (Signed)
Pt d/c home per MD order. Discharge summary reviewed with pt. Pt verbalizes understanding. Pt denies SI/HI/AVH. Bus pass provided per pt request. Pt signed for personal property and property returned. Pt signed e-signature. Ambulatory off unit with MHT.

## 2016-05-30 NOTE — ED Notes (Signed)
Pt extremely mad, states he is mad at the police and shouldn't be here. Therapeutic communication initiated. Pt states if he has to go to jail he will "bang his head up against the wall in the cop car". Per pt his only concern is getting out to take care of his son. Pt comforted and updated on care plan. Ambien offered to assist pt w/ sleep. Pt denied. No physical aggression noted to this Clinical research associatewriter.
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
## 2017-01-22 ENCOUNTER — Encounter (HOSPITAL_COMMUNITY): Payer: Self-pay | Admitting: Emergency Medicine

## 2017-01-22 ENCOUNTER — Emergency Department (HOSPITAL_COMMUNITY)
Admission: EM | Admit: 2017-01-22 | Discharge: 2017-01-22 | Disposition: A | Discharge status: Home / Self Care | Payer: Self-pay | Attending: Emergency Medicine | Admitting: Emergency Medicine

## 2017-01-22 ENCOUNTER — Other Ambulatory Visit: Payer: Self-pay

## 2017-01-22 DIAGNOSIS — F17200 Nicotine dependence, unspecified, uncomplicated: Secondary | ICD-10-CM | POA: Insufficient documentation

## 2017-01-22 DIAGNOSIS — F259 Schizoaffective disorder, unspecified: Secondary | ICD-10-CM | POA: Insufficient documentation

## 2017-01-22 DIAGNOSIS — J069 Acute upper respiratory infection, unspecified: Secondary | ICD-10-CM | POA: Insufficient documentation

## 2017-01-22 DIAGNOSIS — B9789 Other viral agents as the cause of diseases classified elsewhere: Secondary | ICD-10-CM | POA: Insufficient documentation

## 2017-01-22 DIAGNOSIS — R05 Cough: Secondary | ICD-10-CM | POA: Insufficient documentation

## 2017-01-22 DIAGNOSIS — J45909 Unspecified asthma, uncomplicated: Secondary | ICD-10-CM | POA: Insufficient documentation

## 2017-01-22 MED ORDER — PROMETHAZINE-DM 6.25-15 MG/5ML PO SYRP: 5.0000 mL | ORAL_SOLUTION | Freq: Four times a day (QID) | ORAL | 0 refills | Status: DC | PRN

## 2017-01-22 NOTE — ED Triage Notes (Signed)
Pt. Stated, I have a new job and its sorted cold in there. I've had a cold and cough for a couple of days. I will need a note for work.
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
## 2017-01-22 NOTE — ED Notes (Signed)
Declined W/C at D/C and was escorted to lobby by RN. 

## 2017-01-22 NOTE — Discharge Instructions (Signed)
Take 5 mL of promethazine dextromethorphan cough syrup once every 6 hours as needed for nasal congestion and cough.  You can also try using a cool mist vaporizer to help with your symptoms.  Triamcinolone nasal spray or saline nasal spray is available over-the-counter and may also provide additional relief of your symptoms.  600-800 mg of Tylenol may be taken once every 8 hours with food or 650 mg of Tylenol once every 6 hours to help with pain and inflammation.  If you develop new or worsening symptoms, including shortness of breath, fever that does not improve with Tylenol, chest pain, or other new concerning symptoms, please return to the emergency department for reevaluation.

## 2018-05-17 ENCOUNTER — Ambulatory Visit (HOSPITAL_COMMUNITY)
Admission: EM | Admit: 2018-05-17 | Discharge: 2018-05-17 | Disposition: A | Discharge status: Home / Self Care | Payer: Self-pay | Attending: Family Medicine | Admitting: Family Medicine

## 2018-05-17 ENCOUNTER — Encounter (HOSPITAL_COMMUNITY): Payer: Self-pay | Admitting: Emergency Medicine

## 2018-05-17 DIAGNOSIS — Z113 Encounter for screening for infections with a predominantly sexual mode of transmission: Secondary | ICD-10-CM | POA: Insufficient documentation

## 2018-05-17 DIAGNOSIS — Z760 Encounter for issue of repeat prescription: Secondary | ICD-10-CM | POA: Insufficient documentation

## 2018-05-17 DIAGNOSIS — J4520 Mild intermittent asthma, uncomplicated: Secondary | ICD-10-CM | POA: Insufficient documentation

## 2018-05-17 MED ORDER — ALBUTEROL SULFATE HFA 108 (90 BASE) MCG/ACT IN AERS
INHALATION_SPRAY | RESPIRATORY_TRACT | Status: AC
Start: 2018-05-17 — End: ?
  Filled 2018-05-17: qty 6.7

## 2018-05-17 MED ORDER — ALBUTEROL SULFATE HFA 108 (90 BASE) MCG/ACT IN AERS
2.0000 | INHALATION_SPRAY | Freq: Once | RESPIRATORY_TRACT | Status: AC
  Administered 2018-05-17: 2 via RESPIRATORY_TRACT

## 2018-05-17 NOTE — Discharge Instructions (Signed)
Declines treatment today.  Would like to wait on results Urine cytology obtained  HIV/ syphilis testing today We will follow up with you regarding the results of your test If tests are positive, please abstain from sexual activity for at least 7 days and notify partners Follow up with PCP or with [facility] if symptoms persists Return here or go to ER if you have any new or worsening symptoms such as fever, chills, nausea, vomiting, abdominal or pelvic pain, penile rashes or lesions, testicular swelling or pain, etc...  Inhaler given in office.  Use as needed for shortness of breath and/or wheezing

## 2018-05-17 NOTE — ED Triage Notes (Signed)
Pt states he has asthma and needs a refill on his inhaler. Pt also requesting std test, has a new partner. No symptoms.
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
## 2018-05-18 LAB — RPR: RPR Ser Ql: NONREACTIVE

## 2018-05-18 LAB — HIV ANTIBODY (ROUTINE TESTING W REFLEX): HIV Screen 4th Generation wRfx: NONREACTIVE

## 2018-05-24 ENCOUNTER — Other Ambulatory Visit: Payer: Self-pay

## 2018-05-24 ENCOUNTER — Encounter (HOSPITAL_COMMUNITY): Payer: Self-pay | Admitting: Emergency Medicine

## 2018-05-24 ENCOUNTER — Ambulatory Visit (INDEPENDENT_AMBULATORY_CARE_PROVIDER_SITE_OTHER): Payer: Self-pay

## 2018-05-24 ENCOUNTER — Ambulatory Visit (HOSPITAL_COMMUNITY)
Admission: EM | Admit: 2018-05-24 | Discharge: 2018-05-24 | Disposition: A | Discharge status: Home / Self Care | Payer: Self-pay | Attending: Family Medicine | Admitting: Family Medicine

## 2018-05-24 DIAGNOSIS — S61411A Laceration without foreign body of right hand, initial encounter: Secondary | ICD-10-CM

## 2018-05-24 DIAGNOSIS — W25XXXA Contact with sharp glass, initial encounter: Secondary | ICD-10-CM

## 2018-05-24 DIAGNOSIS — S60221A Contusion of right hand, initial encounter: Secondary | ICD-10-CM

## 2018-05-24 NOTE — ED Triage Notes (Signed)
Punched microwave last night and glass broke.  Scrapes to back of right hand, redness, swelling, small cut to base of right middle finger. Bleeding controlled.
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
## 2018-06-11 ENCOUNTER — Encounter (HOSPITAL_COMMUNITY): Payer: Self-pay | Admitting: Family Medicine

## 2018-06-11 ENCOUNTER — Other Ambulatory Visit: Payer: Self-pay

## 2018-06-11 ENCOUNTER — Ambulatory Visit (HOSPITAL_COMMUNITY)
Admission: EM | Admit: 2018-06-11 | Discharge: 2018-06-11 | Disposition: A | Discharge status: Home / Self Care | Payer: Self-pay | Attending: Family Medicine | Admitting: Family Medicine

## 2018-06-11 DIAGNOSIS — S60221D Contusion of right hand, subsequent encounter: Secondary | ICD-10-CM
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
## 2018-06-11 NOTE — Discharge Instructions (Signed)
Work note completed for patient

## 2018-06-11 NOTE — ED Triage Notes (Signed)
The patient presented to the [facility] with a complaint of needing a work release signed to return to work. The patient was evaluated at the [facility] on 05/24/2018 for punching a microwave and stated that he has called out several times since his initial visit and now he needs forms signed to return to work.

## 2018-08-04 ENCOUNTER — Encounter (HOSPITAL_COMMUNITY): Payer: Self-pay

## 2018-08-04 ENCOUNTER — Emergency Department (HOSPITAL_COMMUNITY)
Admission: EM | Admit: 2018-08-04 | Discharge: 2018-08-04 | Disposition: A | Discharge status: Home / Self Care | Payer: Self-pay | Attending: Emergency Medicine | Admitting: Emergency Medicine

## 2018-08-04 ENCOUNTER — Other Ambulatory Visit: Payer: Self-pay

## 2018-08-04 DIAGNOSIS — R197 Diarrhea, unspecified: Secondary | ICD-10-CM | POA: Insufficient documentation

## 2018-08-04 DIAGNOSIS — F17200 Nicotine dependence, unspecified, uncomplicated: Secondary | ICD-10-CM | POA: Insufficient documentation

## 2018-08-04 DIAGNOSIS — R1084 Generalized abdominal pain: Secondary | ICD-10-CM

## 2018-08-04 DIAGNOSIS — R1033 Periumbilical pain: Secondary | ICD-10-CM | POA: Insufficient documentation

## 2018-08-04 DIAGNOSIS — J45909 Unspecified asthma, uncomplicated: Secondary | ICD-10-CM | POA: Insufficient documentation

## 2018-08-04 MED ORDER — ALBUTEROL SULFATE HFA 108 (90 BASE) MCG/ACT IN AERS: 2.0000 | INHALATION_SPRAY | RESPIRATORY_TRACT | 3 refills | Status: DC | PRN

## 2018-08-04 MED ORDER — ONDANSETRON 4 MG PO TBDP: 4.0000 mg | ORAL_TABLET | Freq: Three times a day (TID) | ORAL | 0 refills | Status: DC | PRN

## 2018-08-04 NOTE — ED Notes (Signed)
Patient verbalized understanding of discharge instructions and denies any further needs or questions at this time. VS stable. Patient ambulatory with steady gait.  
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
## 2018-08-04 NOTE — Discharge Instructions (Signed)
Drink plenty of fluids, Zofran as needed, seek emergency medical care for severe vomiting fever or blood in the stools

## 2018-08-04 NOTE — ED Notes (Signed)
ED Provider at bedside. 

## 2018-08-04 NOTE — ED Triage Notes (Signed)
Pt arrives from home c/o abdominal pain that started around 3 am this morning. Pain states he feels like he is "bloated" and that it could be from the hamburger he ate last night.

## 2018-11-24 ENCOUNTER — Encounter (HOSPITAL_COMMUNITY): Payer: Self-pay | Admitting: Family Medicine

## 2018-11-24 ENCOUNTER — Other Ambulatory Visit: Payer: Self-pay | Admitting: Family Medicine

## 2018-11-24 ENCOUNTER — Ambulatory Visit (HOSPITAL_COMMUNITY)
Admission: EM | Admit: 2018-11-24 | Discharge: 2018-11-24 | Disposition: A | Discharge status: Home / Self Care | Payer: Self-pay | Attending: Family Medicine | Admitting: Family Medicine

## 2018-11-24 ENCOUNTER — Other Ambulatory Visit: Payer: Self-pay

## 2018-11-24 DIAGNOSIS — M25512 Pain in left shoulder: Secondary | ICD-10-CM | POA: Insufficient documentation

## 2018-11-24 DIAGNOSIS — Z1159 Encounter for screening for other viral diseases: Secondary | ICD-10-CM

## 2018-11-24 DIAGNOSIS — S2020XA Contusion of thorax, unspecified, initial encounter: Secondary | ICD-10-CM | POA: Insufficient documentation

## 2018-11-24 DIAGNOSIS — J4521 Mild intermittent asthma with (acute) exacerbation: Secondary | ICD-10-CM | POA: Insufficient documentation

## 2018-11-24 DIAGNOSIS — W228XXA Striking against or struck by other objects, initial encounter: Secondary | ICD-10-CM

## 2018-11-24 DIAGNOSIS — M79622 Pain in left upper arm: Secondary | ICD-10-CM | POA: Insufficient documentation

## 2018-11-24 DIAGNOSIS — Z20828 Contact with and (suspected) exposure to other viral communicable diseases: Secondary | ICD-10-CM | POA: Insufficient documentation

## 2018-11-24 DIAGNOSIS — Y929 Unspecified place or not applicable: Secondary | ICD-10-CM | POA: Insufficient documentation

## 2018-11-24 DIAGNOSIS — Z202 Contact with and (suspected) exposure to infections with a predominantly sexual mode of transmission: Secondary | ICD-10-CM | POA: Insufficient documentation

## 2018-11-24 MED ORDER — ALBUTEROL SULFATE HFA 108 (90 BASE) MCG/ACT IN AERS: 2.0000 | INHALATION_SPRAY | RESPIRATORY_TRACT | 1 refills | Status: DC | PRN
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
## 2018-11-25 LAB — CYTOLOGY, (ORAL, ANAL, URETHRAL) ANCILLARY ONLY
Chlamydia: NEGATIVE
Neisseria Gonorrhea: NEGATIVE
Trichomonas: NEGATIVE

## 2018-11-27 ENCOUNTER — Encounter (HOSPITAL_COMMUNITY): Payer: Self-pay

## 2018-11-27 LAB — NOVEL CORONAVIRUS, NAA (HOSP ORDER, SEND-OUT TO REF LAB; TAT 18-24 HRS): SARS-CoV-2, NAA: NOT DETECTED

## 2018-12-19 ENCOUNTER — Other Ambulatory Visit: Payer: Self-pay

## 2018-12-19 ENCOUNTER — Ambulatory Visit (HOSPITAL_COMMUNITY)
Admission: EM | Admit: 2018-12-19 | Discharge: 2018-12-19 | Disposition: A | Discharge status: Home / Self Care | Payer: Self-pay | Attending: Internal Medicine | Admitting: Internal Medicine

## 2018-12-19 ENCOUNTER — Encounter (HOSPITAL_COMMUNITY): Payer: Self-pay | Admitting: Emergency Medicine

## 2018-12-19 DIAGNOSIS — J4521 Mild intermittent asthma with (acute) exacerbation: Secondary | ICD-10-CM | POA: Insufficient documentation

## 2018-12-19 DIAGNOSIS — Z20828 Contact with and (suspected) exposure to other viral communicable diseases: Secondary | ICD-10-CM | POA: Insufficient documentation

## 2018-12-19 HISTORY — DX: Unspecified asthma, uncomplicated: J45.909

## 2018-12-19 MED ORDER — PREDNISONE 10 MG (21) PO TBPK: ORAL_TABLET | Freq: Every day | ORAL | 0 refills | Status: DC

## 2018-12-19 MED ORDER — ALBUTEROL SULFATE HFA 108 (90 BASE) MCG/ACT IN AERS: 2.0000 | INHALATION_SPRAY | RESPIRATORY_TRACT | 1 refills | Status: DC | PRN

## 2018-12-19 NOTE — ED Triage Notes (Addendum)
Pt tested for Covid 3 weeks ago and was negative.  On Saturday he states he was having some issues with his seasonal allergies and asthma.  His breathing issues were bad enough that he called EMS. They gave him some supplemental O2.  Pt was at work today and coughed so they want him to be tested again before he can return to work.  Pt does not have any medications at home but is supposed to have Zyrtec and an inhaler.  He would like to get prescriptions for these if he can.
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
## 2018-12-19 NOTE — Discharge Instructions (Signed)
Use the albuterol inhaler and take the steroid prednisone as directed.    Your COVID test is pending.  You should self quarantine until your test result is back and is negative.    Go to the emergency department if you develop high fever, shortness of breath, severe diarrhea, or other concerning symptoms.

## 2018-12-20 LAB — NOVEL CORONAVIRUS, NAA (HOSP ORDER, SEND-OUT TO REF LAB; TAT 18-24 HRS): SARS-CoV-2, NAA: NOT DETECTED

## 2019-05-04 ENCOUNTER — Emergency Department (HOSPITAL_COMMUNITY): Payer: No Typology Code available for payment source

## 2019-05-04 ENCOUNTER — Other Ambulatory Visit: Payer: Self-pay

## 2019-05-04 ENCOUNTER — Emergency Department (HOSPITAL_COMMUNITY)
Admission: EM | Admit: 2019-05-04 | Discharge: 2019-05-04 | Disposition: A | Discharge status: Home / Self Care | Payer: No Typology Code available for payment source | Attending: Emergency Medicine | Admitting: Emergency Medicine

## 2019-05-04 ENCOUNTER — Encounter (HOSPITAL_COMMUNITY): Payer: Self-pay | Admitting: *Deleted

## 2019-05-04 DIAGNOSIS — F1290 Cannabis use, unspecified, uncomplicated: Secondary | ICD-10-CM | POA: Diagnosis not present

## 2019-05-04 DIAGNOSIS — Z5320 Procedure and treatment not carried out because of patient's decision for unspecified reasons: Secondary | ICD-10-CM | POA: Insufficient documentation

## 2019-05-04 DIAGNOSIS — M542 Cervicalgia: Secondary | ICD-10-CM | POA: Diagnosis not present

## 2019-05-04 DIAGNOSIS — R0789 Other chest pain: Secondary | ICD-10-CM | POA: Diagnosis not present

## 2019-05-04 DIAGNOSIS — R519 Headache, unspecified: Secondary | ICD-10-CM | POA: Diagnosis present

## 2019-05-04 DIAGNOSIS — Z720 Tobacco use: Secondary | ICD-10-CM | POA: Insufficient documentation

## 2019-05-04 DIAGNOSIS — R0781 Pleurodynia: Secondary | ICD-10-CM

## 2019-05-04 MED ORDER — ACETAMINOPHEN 325 MG PO TABS
650.0000 mg | ORAL_TABLET | Freq: Once | ORAL | Status: AC
  Administered 2019-05-04: 650 mg via ORAL
  Filled 2019-05-04: qty 2
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
## 2019-05-04 NOTE — ED Notes (Signed)
Pt returned from x-ray st's he is going to leave because his ride is coming.  Pt st's he will come back tomorrow.  PA made aware and went to talk with pt

## 2019-05-04 NOTE — ED Notes (Signed)
Pt to xray at this time.

## 2019-05-06 ENCOUNTER — Emergency Department (HOSPITAL_COMMUNITY)
Admission: EM | Admit: 2019-05-06 | Discharge: 2019-05-06 | Disposition: A | Discharge status: Home / Self Care | Payer: No Typology Code available for payment source | Attending: Emergency Medicine | Admitting: Emergency Medicine

## 2019-05-06 ENCOUNTER — Emergency Department (HOSPITAL_COMMUNITY): Payer: No Typology Code available for payment source

## 2019-05-06 ENCOUNTER — Other Ambulatory Visit: Payer: Self-pay

## 2019-05-06 ENCOUNTER — Encounter (HOSPITAL_COMMUNITY): Payer: Self-pay | Admitting: Emergency Medicine

## 2019-05-06 DIAGNOSIS — Y93I9 Activity, other involving external motion: Secondary | ICD-10-CM | POA: Diagnosis not present

## 2019-05-06 DIAGNOSIS — M791 Myalgia, unspecified site: Secondary | ICD-10-CM | POA: Insufficient documentation

## 2019-05-06 DIAGNOSIS — J439 Emphysema, unspecified: Secondary | ICD-10-CM

## 2019-05-06 DIAGNOSIS — M7918 Myalgia, other site: Secondary | ICD-10-CM

## 2019-05-06 DIAGNOSIS — Y92410 Unspecified street and highway as the place of occurrence of the external cause: Secondary | ICD-10-CM | POA: Diagnosis not present

## 2019-05-06 DIAGNOSIS — Y999 Unspecified external cause status: Secondary | ICD-10-CM | POA: Diagnosis not present

## 2019-05-06 DIAGNOSIS — R519 Headache, unspecified: Secondary | ICD-10-CM | POA: Diagnosis present

## 2019-05-06 DIAGNOSIS — Z87891 Personal history of nicotine dependence: Secondary | ICD-10-CM | POA: Diagnosis not present

## 2019-05-06 MED ORDER — ACETAMINOPHEN 325 MG PO TABS
650.0000 mg | ORAL_TABLET | Freq: Once | ORAL | Status: AC
  Administered 2019-05-06: 19:00:00 650 mg via ORAL
  Filled 2019-05-06: qty 2
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
## 2019-05-06 NOTE — ED Notes (Signed)
Pt discharge and follow up education provided. Pt is alert and oriented x 4 and ambulatory at discharge. Pt verbalizes understanding.  

## 2019-05-06 NOTE — Discharge Instructions (Signed)
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
## 2019-05-06 NOTE — ED Notes (Signed)
Pt returned from CT °

## 2019-10-22 ENCOUNTER — Ambulatory Visit (HOSPITAL_COMMUNITY)
Admission: EM | Admit: 2019-10-22 | Discharge: 2019-10-22 | Disposition: A | Discharge status: Home / Self Care | Payer: Self-pay | Attending: Family Medicine | Admitting: Family Medicine

## 2019-10-22 ENCOUNTER — Encounter (HOSPITAL_COMMUNITY): Payer: Self-pay

## 2019-10-22 ENCOUNTER — Other Ambulatory Visit: Payer: Self-pay

## 2019-10-22 DIAGNOSIS — Z113 Encounter for screening for infections with a predominantly sexual mode of transmission: Secondary | ICD-10-CM | POA: Insufficient documentation

## 2019-10-22 DIAGNOSIS — R197 Diarrhea, unspecified: Secondary | ICD-10-CM | POA: Insufficient documentation

## 2019-10-22 LAB — HIV ANTIBODY (ROUTINE TESTING W REFLEX): HIV Screen 4th Generation wRfx: NONREACTIVE
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
## 2019-10-22 NOTE — Discharge Instructions (Signed)
The diarrhea is most likely from the covid vaccine. This should resolve.  You can use imodium over the counter as needed Drink plenty of fluids.  We have sent a swab for testing and will call with any positive results.

## 2019-10-22 NOTE — ED Triage Notes (Addendum)
Pt states he had 2nd COVID vaccine on Friday, woke up with swelling to left eye on Saturday. Denies vision changes. Also has had diarrhea x 2 days. "I just didn't want to go to work today because I bend over a lot there."   I also want to get checked for STD's. Denies symptoms, but requesting testing due to new partner.

## 2019-10-23 ENCOUNTER — Ambulatory Visit (HOSPITAL_COMMUNITY)
Admission: EM | Admit: 2019-10-23 | Discharge: 2019-10-23 | Disposition: A | Discharge status: Home / Self Care | Payer: Self-pay | Attending: Family Medicine | Admitting: Family Medicine

## 2019-10-23 LAB — CYTOLOGY, (ORAL, ANAL, URETHRAL) ANCILLARY ONLY
Chlamydia: NEGATIVE
Comment: NEGATIVE
Comment: NEGATIVE
Comment: NORMAL
Neisseria Gonorrhea: NEGATIVE
Trichomonas: POSITIVE — AB

## 2019-10-23 MED ORDER — METRONIDAZOLE 500 MG PO TABS: 2000.0000 mg | ORAL_TABLET | Freq: Two times a day (BID) | ORAL | 0 refills | Status: AC

## 2019-10-23 NOTE — ED Notes (Signed)
Pt arrived for Rx for trichomonas which pt noticed was positive on mychart. Despina Arias sent Rx of flagyl to pharmacy ([facility]) and explained to pt need to abstain from alcohol and any sexual relations for at least 7 days.

## 2019-10-25 LAB — RPR: RPR Ser Ql: NONREACTIVE — AB

## 2020-01-25 ENCOUNTER — Other Ambulatory Visit: Payer: Self-pay

## 2020-01-25 ENCOUNTER — Encounter (HOSPITAL_COMMUNITY): Payer: Self-pay

## 2020-01-25 ENCOUNTER — Ambulatory Visit (HOSPITAL_COMMUNITY)
Admission: EM | Admit: 2020-01-25 | Discharge: 2020-01-25 | Disposition: A | Discharge status: Home / Self Care | Payer: Self-pay | Attending: Family Medicine | Admitting: Family Medicine

## 2020-01-25 DIAGNOSIS — Z113 Encounter for screening for infections with a predominantly sexual mode of transmission: Secondary | ICD-10-CM

## 2020-01-25 DIAGNOSIS — R197 Diarrhea, unspecified: Secondary | ICD-10-CM | POA: Insufficient documentation

## 2020-01-25 NOTE — Discharge Instructions (Addendum)
Work note provided [employer] sent for STD screening  Follow up as needed for continued or worsening symptoms

## 2020-01-25 NOTE — ED Triage Notes (Signed)
Pt reports diarrhea and abdominal pain since last night. States he used the restroom 4 times last night. Denies fever, chills, cough, nasal congestion.   Pt requested STD's testing. Denies penile discharge/itchiness, dysuria.

## 2020-01-28 LAB — CYTOLOGY, (ORAL, ANAL, URETHRAL) ANCILLARY ONLY
Chlamydia: NEGATIVE
Comment: NEGATIVE
Comment: NEGATIVE
Comment: NORMAL
Neisseria Gonorrhea: NEGATIVE
Trichomonas: NEGATIVE
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
## 2020-04-12 ENCOUNTER — Ambulatory Visit (HOSPITAL_COMMUNITY)
Admission: EM | Admit: 2020-04-12 | Discharge: 2020-04-12 | Disposition: A | Discharge status: Home / Self Care | Payer: Self-pay | Attending: Student | Admitting: Student

## 2020-04-12 ENCOUNTER — Other Ambulatory Visit: Payer: Self-pay

## 2020-04-12 DIAGNOSIS — Z20822 Contact with and (suspected) exposure to covid-19: Secondary | ICD-10-CM | POA: Insufficient documentation

## 2020-04-12 DIAGNOSIS — Z113 Encounter for screening for infections with a predominantly sexual mode of transmission: Secondary | ICD-10-CM | POA: Insufficient documentation

## 2020-04-12 DIAGNOSIS — Z1152 Encounter for screening for COVID-19: Secondary | ICD-10-CM | POA: Insufficient documentation

## 2020-04-12 NOTE — Discharge Instructions (Addendum)
-  We've sent testing for STDs today.  We will call you if anything is positive.  We will send over the appropriate antibiotic if this is necessary. -We are currently awaiting result of your PCR covid-19 test. This typically comes back in 1-2 days. We'll call you if the result is positive. Otherwise, the result will be sent electronically to your MyChart. You can also call this clinic and ask for your result via telephone.

## 2020-04-12 NOTE — ED Triage Notes (Signed)
PT presents today requesting a COVID test and a STD test. Pt denies any Sx's
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
## 2020-04-13 ENCOUNTER — Emergency Department (HOSPITAL_COMMUNITY)
Admission: EM | Admit: 2020-04-13 | Discharge: 2020-04-13 | Disposition: A | Discharge status: Home / Self Care | Payer: Self-pay | Attending: Emergency Medicine | Admitting: Emergency Medicine

## 2020-04-13 ENCOUNTER — Other Ambulatory Visit: Payer: Self-pay

## 2020-04-13 ENCOUNTER — Encounter (HOSPITAL_COMMUNITY): Payer: Self-pay | Admitting: Emergency Medicine

## 2020-04-13 DIAGNOSIS — Z87891 Personal history of nicotine dependence: Secondary | ICD-10-CM | POA: Insufficient documentation

## 2020-04-13 DIAGNOSIS — A599 Trichomoniasis, unspecified: Secondary | ICD-10-CM | POA: Insufficient documentation

## 2020-04-13 DIAGNOSIS — J4540 Moderate persistent asthma, uncomplicated: Secondary | ICD-10-CM | POA: Insufficient documentation

## 2020-04-13 LAB — CYTOLOGY, (ORAL, ANAL, URETHRAL) ANCILLARY ONLY
Chlamydia: NEGATIVE
Comment: NEGATIVE
Comment: NEGATIVE
Comment: NORMAL
Neisseria Gonorrhea: NEGATIVE
Trichomonas: POSITIVE — AB

## 2020-04-13 LAB — SARS CORONAVIRUS 2 (TAT 6-24 HRS): SARS Coronavirus 2: NEGATIVE

## 2020-04-13 MED ORDER — METRONIDAZOLE 500 MG PO TABS: 500.0000 mg | ORAL_TABLET | Freq: Two times a day (BID) | ORAL | 0 refills | Status: DC
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
## 2020-04-13 NOTE — ED Triage Notes (Signed)
Pt states he was seen yesterday at [facility] for STD testing and his MyChart results came back as + for Trichomonas.  States he isn't having any symptoms and returned to [facility] today to get a Rx and was told the provider wasn't there that he saw yesterday.

## 2020-04-13 NOTE — Discharge Instructions (Addendum)
You have tested positive for trichomonas.  This is an STD.  Please abstain from sex for 2 weeks, and have all partners treated.  Please take the medication as directed.  Do not drink on this medication as it will make you very nauseous.  Further STD testing, please go to the [facility], phone number and address provided.  Please return to the ER for any new or worsening symptoms.

## 2020-04-27 ENCOUNTER — Other Ambulatory Visit: Payer: Self-pay

## 2020-04-27 ENCOUNTER — Encounter (HOSPITAL_COMMUNITY): Payer: Self-pay

## 2020-04-27 ENCOUNTER — Emergency Department (HOSPITAL_COMMUNITY)
Admission: EM | Admit: 2020-04-27 | Discharge: 2020-04-28 | Disposition: A | Discharge status: Other Acute Inpatient Hospital | Payer: Self-pay | Attending: Emergency Medicine | Admitting: Emergency Medicine

## 2020-04-27 DIAGNOSIS — F4325 Adjustment disorder with mixed disturbance of emotions and conduct: Secondary | ICD-10-CM | POA: Insufficient documentation

## 2020-04-27 DIAGNOSIS — Z79899 Other long term (current) drug therapy: Secondary | ICD-10-CM | POA: Insufficient documentation

## 2020-04-27 DIAGNOSIS — Z87891 Personal history of nicotine dependence: Secondary | ICD-10-CM | POA: Insufficient documentation

## 2020-04-27 DIAGNOSIS — J45998 Other asthma: Secondary | ICD-10-CM | POA: Insufficient documentation

## 2020-04-27 DIAGNOSIS — F259 Schizoaffective disorder, unspecified: Secondary | ICD-10-CM | POA: Insufficient documentation

## 2020-04-27 DIAGNOSIS — Z20822 Contact with and (suspected) exposure to covid-19: Secondary | ICD-10-CM | POA: Insufficient documentation

## 2020-04-27 LAB — CBC
HCT: 46.4 % (ref 39.0–52.0)
Hemoglobin: 14.2 g/dL (ref 13.0–17.0)
MCH: 21.1 pg — ABNORMAL LOW (ref 26.0–34.0)
MCHC: 30.6 g/dL (ref 30.0–36.0)
MCV: 68.9 fL — ABNORMAL LOW (ref 80.0–100.0)
Platelets: 261 10*3/uL (ref 150–400)
RBC: 6.73 MIL/uL — ABNORMAL HIGH (ref 4.22–5.81)
RDW: 17.3 % — ABNORMAL HIGH (ref 11.5–15.5)
WBC: 9 10*3/uL (ref 4.0–10.5)
nRBC: 0 % (ref 0.0–0.2)

## 2020-04-27 LAB — RAPID URINE DRUG SCREEN, HOSP PERFORMED
Amphetamines: NOT DETECTED
Barbiturates: NOT DETECTED
Benzodiazepines: NOT DETECTED
Cocaine: NOT DETECTED
Opiates: NOT DETECTED
Tetrahydrocannabinol: POSITIVE — AB

## 2020-04-27 LAB — RESP PANEL BY RT-PCR (FLU A&B, COVID) ARPGX2
Influenza A by PCR: NEGATIVE
Influenza B by PCR: NEGATIVE
SARS Coronavirus 2 by RT PCR: NEGATIVE

## 2020-04-27 LAB — ACETAMINOPHEN LEVEL: Acetaminophen (Tylenol), Serum: <10 ug/mL — ABNORMAL LOW (ref 10–30)

## 2020-04-27 LAB — COMPREHENSIVE METABOLIC PANEL
ALT: 20 U/L (ref 0–44)
AST: 40 U/L (ref 15–41)
Albumin: 4.4 g/dL (ref 3.5–5.0)
Alkaline Phosphatase: 84 U/L (ref 38–126)
Anion gap: 12 (ref 5–15)
BUN: 15 mg/dL (ref 6–20)
CO2: 22 mmol/L (ref 22–32)
Calcium: 9.6 mg/dL (ref 8.9–10.3)
Chloride: 100 mmol/L (ref 98–111)
Creatinine, Ser: 1.17 mg/dL (ref 0.61–1.24)
GFR, Estimated: >60 mL/min (ref >60)
Glucose, Bld: 92 mg/dL (ref 70–99)
Potassium: 4.2 mmol/L (ref 3.5–5.1)
Sodium: 134 mmol/L — ABNORMAL LOW (ref 135–145)
Total Bilirubin: 0.8 mg/dL (ref 0.3–1.2)
Total Protein: 8 g/dL (ref 6.5–8.1)

## 2020-04-27 LAB — SALICYLATE LEVEL: Salicylate Lvl: <7 mg/dL — ABNORMAL LOW (ref 7.0–30.0)

## 2020-04-27 LAB — ETHANOL: Alcohol, Ethyl (B): <10 mg/dL (ref <10)

## 2020-04-27 MED ORDER — ZIPRASIDONE MESYLATE 20 MG IM SOLR
20.0000 mg | Freq: Once | INTRAMUSCULAR | Status: AC
  Administered 2020-04-27: 20 mg via INTRAMUSCULAR
  Filled 2020-04-27: qty 20

## 2020-04-27 MED ORDER — LORAZEPAM 2 MG/ML IJ SOLN
2.0000 mg | Freq: Once | INTRAMUSCULAR | Status: AC
  Administered 2020-04-27: 2 mg via INTRAMUSCULAR
  Filled 2020-04-27: qty 1

## 2020-04-27 MED ORDER — DIPHENHYDRAMINE HCL 50 MG/ML IJ SOLN
50.0000 mg | Freq: Once | INTRAMUSCULAR | Status: AC
  Administered 2020-04-27: 50 mg via INTRAMUSCULAR
  Filled 2020-04-27: qty 1
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
## 2020-04-27 NOTE — ED Notes (Signed)
Called [facility] added to list

## 2020-04-27 NOTE — ED Notes (Signed)
Patient remains sleep from needing sedation due to violent behavior; Vitals will be taken when patient is fully  Awake; Pt moving around in bed and breathing is WNL-Monique,RN

## 2020-04-27 NOTE — ED Notes (Signed)
Service Resource called @ 1803-Dinner [place]ray Ordered. 

## 2020-04-27 NOTE — ED Notes (Signed)
Pt is now resting in bed, cooperative with covid swab
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
## 2020-04-28 MED ORDER — LORAZEPAM 2 MG/ML IJ SOLN: 2.0000 mg | Freq: Four times a day (QID) | INTRAMUSCULAR | Status: DC | PRN

## 2020-04-28 MED ORDER — ZIPRASIDONE MESYLATE 20 MG IM SOLR: 20.0000 mg | Freq: Once | INTRAMUSCULAR | Status: DC

## 2020-04-28 NOTE — ED Notes (Signed)
Pt states he does not want this RN "the white witch" taking to him.. Pt states I want a black woman taking to me. This white witch is doing me wrong. I want my stuff and get the fuck out of here. "

## 2020-04-28 NOTE — ED Notes (Signed)
Pt has asked several times to use the phone so he can call someone to bring him shoes. This RN explained to pt multiple times that he has used his allotted phone calls for the day. He will not be allowed to use the phone again today unless he is discharged. Before first phone call of the day it was explained to pt that he could only make 2 5 minute calls per days per rules. Pt verbalized understanding then and pt verbalizing understanding now.   Pt is very anxious but cooperative at this time.

## 2020-04-28 NOTE — ED Provider Notes (Addendum)
Emergency Medicine Observation Re-evaluation Note  Adrian Mcgrath is a 30 y.o. male, seen on rounds today.  Pt initially presented to the ED for complaints of Schizophrenia (Patient his history of schizoaffective disorder ) Currently, the patient is sitting in a chair rocking back and forth. Awaiting inpatient placement.   Physical Exam  BP 110/81 (BP Location: Left Arm)   Pulse 64   Temp 97.8 F (36.6 C) (Oral)   Resp 18   SpO2 98%  Physical Exam General: Alert Cardiac: Normal rate and rhythm Lungs: No distress Psych: Slightly agitated  ED Course / MDM  EKG:  Clinical Course as of 04/28/20 0939  Sun Apr 27, 2020  1449 Tetrahydrocannabinol(!): POSITIVE [HK]    Clinical Course User Index [HK] Westwood, Hina, PA-C   I have reviewed the labs performed to date as well as medications administered while in observation.  Recent changes in the last 24 hours include pt becoming intermittently aggressive; was aggressive with staff this morning and Geodon was ordered however pt calmed himself down after security/[employer] arrived. Geodon held at this time.   Plan  Current plan is for inpatient treatment. Patient is under full IVC at this time.   11:30 AM TTS has informed me that pt no longer meets inpatient admission. He is being discharged and taken into custody by [employer] as pt cut off his ankle monitor yesterday. IVC paperwork has been rescinded.     Tanda Rockers, PA-C 04/28/20 1131    Mancel Bale, MD 04/30/20 [phone]

## 2020-04-28 NOTE — ED Notes (Signed)
Pt redirected to stay in room or doorway. Pt stated he was sorry for being disrespectful to staff.

## 2020-04-28 NOTE — ED Notes (Signed)
Pt using phone to call cousin. 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 This RN explained to pt [facility] will be making rounds. It will be their decision on further treatment.  Pt began yelling during phone call and is yelling, " Just calll [employer] and take me to fucking jail. Pt escalating and will not stay in room. Pt took off shirt and is pacing outside of room.

## 2020-04-28 NOTE — ED Notes (Signed)
Pt using second phone call for the day. Pt notified this will be his last phone call of the day. Pt ignoring this RN. Pt yelling on phone and cussing.   Security called and is in purple zone.

## 2020-04-28 NOTE — ED Notes (Signed)
Patient refused a Snack and Drink.

## 2020-04-28 NOTE — ED Notes (Signed)
IVC/Violent/Inpt tx Breakfast Orders Placed

## 2020-04-28 NOTE — ED Notes (Signed)
Pt in room being evaluated by TTS

## 2020-04-28 NOTE — ED Notes (Signed)
RN faxed rescinding paperwork to clerk of court. Copy is in medical record drawer and red folder.
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
## 2020-05-13 ENCOUNTER — Ambulatory Visit (HOSPITAL_COMMUNITY): Admission: EM | Admit: 2020-05-13 | Discharge: 2020-05-13 | Discharge status: Against Medical Advice | Payer: Self-pay

## 2020-05-13 ENCOUNTER — Emergency Department (HOSPITAL_COMMUNITY)
Admission: EM | Admit: 2020-05-13 | Discharge: 2020-05-13 | Disposition: A | Discharge status: Home / Self Care | Payer: Self-pay | Attending: Emergency Medicine | Admitting: Emergency Medicine

## 2020-05-13 ENCOUNTER — Encounter (HOSPITAL_COMMUNITY): Payer: Self-pay

## 2020-05-13 ENCOUNTER — Other Ambulatory Visit: Payer: Self-pay

## 2020-05-13 DIAGNOSIS — Z0289 Encounter for other administrative examinations: Secondary | ICD-10-CM

## 2020-05-13 DIAGNOSIS — J45909 Unspecified asthma, uncomplicated: Secondary | ICD-10-CM | POA: Insufficient documentation

## 2020-05-13 DIAGNOSIS — Z87891 Personal history of nicotine dependence: Secondary | ICD-10-CM | POA: Insufficient documentation

## 2020-05-13 DIAGNOSIS — Z0279 Encounter for issue of other medical certificate: Secondary | ICD-10-CM | POA: Insufficient documentation
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
## 2020-05-13 NOTE — ED Notes (Signed)
Patient verbalizes understanding of discharge instructions. Opportunity for questioning and answers were provided. Armband removed by staff, pt discharged from ED.  

## 2020-05-13 NOTE — ED Triage Notes (Signed)
Pt requesting work note to return back to work after being discharge from the hospital on 2/20.

## 2020-05-14 ENCOUNTER — Encounter (HOSPITAL_BASED_OUTPATIENT_CLINIC_OR_DEPARTMENT_OTHER): Payer: Self-pay | Admitting: Emergency Medicine

## 2020-05-14 ENCOUNTER — Other Ambulatory Visit: Payer: Self-pay

## 2020-05-14 ENCOUNTER — Emergency Department (HOSPITAL_BASED_OUTPATIENT_CLINIC_OR_DEPARTMENT_OTHER)
Admission: EM | Admit: 2020-05-14 | Discharge: 2020-05-15 | Disposition: A | Discharge status: Other Acute Inpatient Hospital | Payer: Self-pay | Attending: Emergency Medicine | Admitting: Emergency Medicine

## 2020-05-14 DIAGNOSIS — F1994 Other psychoactive substance use, unspecified with psychoactive substance-induced mood disorder: Secondary | ICD-10-CM | POA: Insufficient documentation

## 2020-05-14 DIAGNOSIS — F259 Schizoaffective disorder, unspecified: Secondary | ICD-10-CM | POA: Insufficient documentation

## 2020-05-14 DIAGNOSIS — Z87891 Personal history of nicotine dependence: Secondary | ICD-10-CM | POA: Insufficient documentation

## 2020-05-14 DIAGNOSIS — J45909 Unspecified asthma, uncomplicated: Secondary | ICD-10-CM | POA: Insufficient documentation

## 2020-05-14 DIAGNOSIS — F22 Delusional disorders: Secondary | ICD-10-CM | POA: Insufficient documentation

## 2020-05-14 DIAGNOSIS — F200 Paranoid schizophrenia: Secondary | ICD-10-CM

## 2020-05-14 DIAGNOSIS — Z20822 Contact with and (suspected) exposure to covid-19: Secondary | ICD-10-CM | POA: Insufficient documentation

## 2020-05-14 DIAGNOSIS — F23 Brief psychotic disorder: Secondary | ICD-10-CM | POA: Insufficient documentation

## 2020-05-14 LAB — COMPREHENSIVE METABOLIC PANEL
ALT: 20 U/L (ref 0–44)
AST: 35 U/L (ref 15–41)
Albumin: 4.4 g/dL (ref 3.5–5.0)
Alkaline Phosphatase: 81 U/L (ref 38–126)
Anion gap: 11 (ref 5–15)
BUN: 14 mg/dL (ref 6–20)
CO2: 25 mmol/L (ref 22–32)
Calcium: 9.6 mg/dL (ref 8.9–10.3)
Chloride: 103 mmol/L (ref 98–111)
Creatinine, Ser: 1.24 mg/dL (ref 0.61–1.24)
GFR, Estimated: >60 mL/min (ref >60)
Glucose, Bld: 96 mg/dL (ref 70–99)
Potassium: 4 mmol/L (ref 3.5–5.1)
Sodium: 139 mmol/L (ref 135–145)
Total Bilirubin: 0.5 mg/dL (ref 0.3–1.2)
Total Protein: 7.8 g/dL (ref 6.5–8.1)

## 2020-05-14 LAB — CBC WITH DIFFERENTIAL/PLATELET
Abs Immature Granulocytes: 0.02 10*3/uL (ref 0.00–0.07)
Basophils Absolute: 0.1 10*3/uL (ref 0.0–0.1)
Basophils Relative: 0 %
Eosinophils Absolute: 0.1 10*3/uL (ref 0.0–0.5)
Eosinophils Relative: 1 %
HCT: 41.7 % (ref 39.0–52.0)
Hemoglobin: 13.4 g/dL (ref 13.0–17.0)
Immature Granulocytes: 0 %
Lymphocytes Relative: 36 %
Lymphs Abs: 4 10*3/uL (ref 0.7–4.0)
MCH: 21.6 pg — ABNORMAL LOW (ref 26.0–34.0)
MCHC: 32.1 g/dL (ref 30.0–36.0)
MCV: 67.3 fL — ABNORMAL LOW (ref 80.0–100.0)
Monocytes Absolute: 0.6 10*3/uL (ref 0.1–1.0)
Monocytes Relative: 6 %
Neutro Abs: 6.4 10*3/uL (ref 1.7–7.7)
Neutrophils Relative %: 57 %
Platelets: 319 10*3/uL (ref 150–400)
RBC: 6.2 MIL/uL — ABNORMAL HIGH (ref 4.22–5.81)
RDW: 16.6 % — ABNORMAL HIGH (ref 11.5–15.5)
WBC: 11.2 10*3/uL — ABNORMAL HIGH (ref 4.0–10.5)
nRBC: 0 % (ref 0.0–0.2)

## 2020-05-14 LAB — RESP PANEL BY RT-PCR (FLU A&B, COVID) ARPGX2
Influenza A by PCR: NEGATIVE
Influenza B by PCR: NEGATIVE
SARS Coronavirus 2 by RT PCR: NEGATIVE

## 2020-05-14 LAB — ACETAMINOPHEN LEVEL: Acetaminophen (Tylenol), Serum: <10 ug/mL — ABNORMAL LOW (ref 10–30)

## 2020-05-14 LAB — ETHANOL: Alcohol, Ethyl (B): <10 mg/dL (ref <10)

## 2020-05-14 LAB — SALICYLATE LEVEL: Salicylate Lvl: <7 mg/dL — ABNORMAL LOW (ref 7.0–30.0)

## 2020-05-14 MED ORDER — ALUM & MAG HYDROXIDE-SIMETH 200-200-20 MG/5ML PO SUSP: 30.0000 mL | Freq: Four times a day (QID) | ORAL | Status: DC | PRN

## 2020-05-14 MED ORDER — ACETAMINOPHEN 325 MG PO TABS: 650.0000 mg | ORAL_TABLET | ORAL | Status: DC | PRN

## 2020-05-14 MED ORDER — ZIPRASIDONE MESYLATE 20 MG IM SOLR
20.0000 mg | Freq: Once | INTRAMUSCULAR | Status: AC
  Administered 2020-05-14: 20 mg via INTRAMUSCULAR
  Filled 2020-05-14: qty 20

## 2020-05-14 MED ORDER — HALOPERIDOL 5 MG PO TABS
5.0000 mg | ORAL_TABLET | Freq: Once | ORAL | Status: DC
  Filled 2020-05-14: qty 1

## 2020-05-14 MED ORDER — ONDANSETRON 4 MG PO TBDP: 4.0000 mg | ORAL_TABLET | Freq: Three times a day (TID) | ORAL | Status: DC | PRN
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
## 2020-05-14 NOTE — ED Notes (Addendum)
BH scrubs brought to room and belonging bags. Nurse to start IV. Pending EKG, change out, and wanding per RN.
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
## 2020-05-15 ENCOUNTER — Other Ambulatory Visit: Payer: Self-pay | Admitting: Psychiatric/Mental Health

## 2020-05-15 ENCOUNTER — Encounter (HOSPITAL_BASED_OUTPATIENT_CLINIC_OR_DEPARTMENT_OTHER): Payer: Self-pay

## 2020-05-15 ENCOUNTER — Encounter (HOSPITAL_COMMUNITY): Payer: Self-pay | Admitting: Psychiatry

## 2020-05-15 ENCOUNTER — Inpatient Hospital Stay (HOSPITAL_COMMUNITY)
Admission: AD | Admit: 2020-05-15 | Discharge: 2020-05-18 | DRG: 885 | Disposition: A | Discharge status: Home / Self Care | Payer: Federal, State, Local not specified - Other | Source: Intra-hospital | Attending: Psychiatry | Admitting: Psychiatry

## 2020-05-15 DIAGNOSIS — F1520 Other stimulant dependence, uncomplicated: Secondary | ICD-10-CM | POA: Diagnosis present

## 2020-05-15 DIAGNOSIS — G4700 Insomnia, unspecified: Secondary | ICD-10-CM | POA: Diagnosis present

## 2020-05-15 DIAGNOSIS — F250 Schizoaffective disorder, bipolar type: Principal | ICD-10-CM | POA: Diagnosis present

## 2020-05-15 DIAGNOSIS — F17210 Nicotine dependence, cigarettes, uncomplicated: Secondary | ICD-10-CM | POA: Diagnosis present

## 2020-05-15 DIAGNOSIS — Z8249 Family history of ischemic heart disease and other diseases of the circulatory system: Secondary | ICD-10-CM | POA: Diagnosis not present

## 2020-05-15 DIAGNOSIS — F259 Schizoaffective disorder, unspecified: Secondary | ICD-10-CM | POA: Diagnosis present

## 2020-05-15 DIAGNOSIS — R45850 Homicidal ideations: Secondary | ICD-10-CM | POA: Diagnosis present

## 2020-05-15 DIAGNOSIS — Z9119 Patient's noncompliance with other medical treatment and regimen: Secondary | ICD-10-CM

## 2020-05-15 DIAGNOSIS — F419 Anxiety disorder, unspecified: Secondary | ICD-10-CM | POA: Diagnosis present

## 2020-05-15 DIAGNOSIS — J45909 Unspecified asthma, uncomplicated: Secondary | ICD-10-CM | POA: Diagnosis present

## 2020-05-15 DIAGNOSIS — F1220 Cannabis dependence, uncomplicated: Secondary | ICD-10-CM | POA: Diagnosis present

## 2020-05-15 DIAGNOSIS — Z888 Allergy status to other drugs, medicaments and biological substances status: Secondary | ICD-10-CM

## 2020-05-15 DIAGNOSIS — Z79899 Other long term (current) drug therapy: Secondary | ICD-10-CM

## 2020-05-15 LAB — URINALYSIS, ROUTINE W REFLEX MICROSCOPIC
Glucose, UA: NEGATIVE mg/dL
Hgb urine dipstick: NEGATIVE
Ketones, ur: 15 mg/dL — AB
Leukocytes,Ua: NEGATIVE
Nitrite: NEGATIVE
Protein, ur: NEGATIVE mg/dL
Specific Gravity, Urine: >1.03 — ABNORMAL HIGH (ref 1.005–1.030)
pH: 6 (ref 5.0–8.0)

## 2020-05-15 LAB — RAPID URINE DRUG SCREEN, HOSP PERFORMED
Amphetamines: POSITIVE — AB
Barbiturates: NOT DETECTED
Benzodiazepines: NOT DETECTED
Cocaine: NOT DETECTED
Opiates: NOT DETECTED
Tetrahydrocannabinol: POSITIVE — AB

## 2020-05-15 MED ORDER — ALUM & MAG HYDROXIDE-SIMETH 200-200-20 MG/5ML PO SUSP: 30.0000 mL | ORAL | Status: DC | PRN

## 2020-05-15 MED ORDER — TRAZODONE HCL 50 MG PO TABS: 50.0000 mg | ORAL_TABLET | Freq: Every evening | ORAL | Status: DC | PRN

## 2020-05-15 MED ORDER — LORAZEPAM 1 MG PO TABS
1.0000 mg | ORAL_TABLET | Freq: Four times a day (QID) | ORAL | Status: DC | PRN
  Administered 2020-05-16: 1 mg via ORAL
  Filled 2020-05-15: qty 1

## 2020-05-15 MED ORDER — ACETAMINOPHEN 325 MG PO TABS
650.0000 mg | ORAL_TABLET | Freq: Four times a day (QID) | ORAL | Status: DC | PRN
  Administered 2020-05-15: 650 mg via ORAL
  Filled 2020-05-15: qty 2

## 2020-05-15 MED ORDER — THIAMINE HCL 100 MG PO TABS
100.0000 mg | ORAL_TABLET | Freq: Every day | ORAL | Status: DC
  Administered 2020-05-16 – 2020-05-18 (×3): 100 mg via ORAL
  Filled 2020-05-15 (×6): qty 1

## 2020-05-15 MED ORDER — ONDANSETRON 4 MG PO TBDP: 4.0000 mg | ORAL_TABLET | Freq: Four times a day (QID) | ORAL | Status: DC | PRN

## 2020-05-15 MED ORDER — ZIPRASIDONE MESYLATE 20 MG IM SOLR: 20.0000 mg | Freq: Two times a day (BID) | INTRAMUSCULAR | Status: DC | PRN

## 2020-05-15 MED ORDER — RISPERIDONE 2 MG PO TBDP: 2.0000 mg | ORAL_TABLET | Freq: Three times a day (TID) | ORAL | Status: DC | PRN

## 2020-05-15 MED ORDER — MAGNESIUM HYDROXIDE 400 MG/5ML PO SUSP
30.0000 mL | Freq: Every day | ORAL | Status: DC | PRN
  Administered 2020-05-16: 30 mL via ORAL
  Filled 2020-05-15: qty 30

## 2020-05-15 MED ORDER — ZIPRASIDONE MESYLATE 20 MG IM SOLR: 20.0000 mg | INTRAMUSCULAR | Status: DC | PRN

## 2020-05-15 MED ORDER — PALIPERIDONE ER 6 MG PO TB24
6.0000 mg | ORAL_TABLET | Freq: Every day | ORAL | Status: DC
  Administered 2020-05-15: 6 mg via ORAL
  Filled 2020-05-15 (×3): qty 1

## 2020-05-15 MED ORDER — DIPHENHYDRAMINE HCL 50 MG/ML IJ SOLN
50.0000 mg | Freq: Four times a day (QID) | INTRAMUSCULAR | Status: DC | PRN
  Administered 2020-05-16: 50 mg via INTRAMUSCULAR
  Filled 2020-05-15: qty 1

## 2020-05-15 MED ORDER — HYDROXYZINE HCL 25 MG PO TABS
25.0000 mg | ORAL_TABLET | Freq: Four times a day (QID) | ORAL | Status: DC | PRN
  Administered 2020-05-17: 25 mg via ORAL
  Filled 2020-05-15: qty 1

## 2020-05-15 MED ORDER — LOPERAMIDE HCL 2 MG PO CAPS: 2.0000 mg | ORAL_CAPSULE | ORAL | Status: DC | PRN

## 2020-05-15 MED ORDER — PALIPERIDONE ER 6 MG PO TB24
6.0000 mg | ORAL_TABLET | Freq: Every day | ORAL | Status: DC
  Administered 2020-05-15 – 2020-05-16 (×2): 6 mg via ORAL
  Filled 2020-05-15 (×4): qty 1

## 2020-05-15 MED ORDER — ADULT MULTIVITAMIN W/MINERALS CH
1.0000 | ORAL_TABLET | Freq: Every day | ORAL | Status: DC
  Administered 2020-05-15 – 2020-05-18 (×4): 1 via ORAL
  Filled 2020-05-15 (×8): qty 1

## 2020-05-15 MED ORDER — LORAZEPAM 1 MG PO TABS: 1.0000 mg | ORAL_TABLET | ORAL | Status: DC | PRN

## 2020-05-15 MED ORDER — LORAZEPAM 2 MG/ML IJ SOLN
1.0000 mg | Freq: Two times a day (BID) | INTRAMUSCULAR | Status: DC | PRN
  Administered 2020-05-16: 1 mg via INTRAMUSCULAR
  Filled 2020-05-15: qty 1

## 2020-05-15 MED ORDER — ZIPRASIDONE MESYLATE 20 MG IM SOLR
20.0000 mg | Freq: Once | INTRAMUSCULAR | Status: AC
  Administered 2020-05-15: 20 mg via INTRAMUSCULAR
  Filled 2020-05-15: qty 20

## 2020-05-15 MED ORDER — DIPHENHYDRAMINE HCL 25 MG PO CAPS
50.0000 mg | ORAL_CAPSULE | Freq: Two times a day (BID) | ORAL | Status: DC | PRN
  Administered 2020-05-16: 50 mg via ORAL
  Filled 2020-05-15: qty 2

## 2020-05-15 MED ORDER — LORAZEPAM 1 MG PO TABS: 1.0000 mg | ORAL_TABLET | Freq: Two times a day (BID) | ORAL | Status: DC | PRN
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
## 2020-05-15 NOTE — BH Assessment (Signed)
4:01 am TTS attempted to complete consult per Nurse patient is still sleeping .

## 2020-05-15 NOTE — ED Notes (Signed)
Mother Boyd Kerbs contacted to make aware of transfer to [facility]

## 2020-05-15 NOTE — ED Notes (Signed)
[facility] called and notified that patient is awake.  Informed them that the patient is having some escalation and should be TTS soon before he requires sedation.  Pt is arguing with staff, wanting to use the phone.  Informed patient that he must be evaluated by our [facility] team.  He is stating that he has already been evaluated.  Pt states he wants to call his family.  Security on stand by.  Meal offered by staff.
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
## 2020-05-15 NOTE — BH Assessment (Signed)
Per [facility], received a call from staff at [facility] stating that patient ready to be seen by TTS.  Contacted charge nurse and requested TTS machine to be placed in patient's room.

## 2020-05-15 NOTE — ED Notes (Signed)
Pt pacing at doorway of room, asking to leave, security close by, pt for TTS re-evaluation.  Pt provided breakfast and fluids.

## 2020-05-15 NOTE — ED Notes (Signed)
Bed available at [facility], report called to unit, spoke to [place], Charity fundraiser.  Pt under IVC, arranging transport via Patent examiner.
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
## 2020-05-15 NOTE — Care Management (Signed)
Patient has been accepted to [facility] Bed [facility]  The accepting is Dr. Jola Babinski.   The patient can come at 4pm.   The number to give report is [phone].  Writer informed the RN Seward Grater

## 2020-05-15 NOTE — ED Notes (Signed)
Pt pacing, yelling at staff, security at bedside attempting to de-escalate behavior, attempts unsuccessful.  Continues to pace and swear at staff to be able to use phone.  Physician gave order for geodon to be given with law enforcement standby

## 2020-05-15 NOTE — BH Assessment (Signed)
12:00 TTS attempted to complete consult. TTS advised by Nursing staff that patient was given Geodon for being aggressive with staff and was sleeping. TTS will be notified when patient is available for consult.

## 2020-05-15 NOTE — ED Notes (Signed)
Pt has a pocket knife secured with security. Family made aware that it will need to be picked up.

## 2020-05-15 NOTE — ED Notes (Signed)
Pt assisted with toileting, cooperative with care.  Resting quietly with eyes closed.
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
## 2020-05-15 NOTE — ED Notes (Signed)
Pt more cooperative with care.  VS WDL

## 2020-05-15 NOTE — ED Notes (Signed)
Mom and girlfriend updated on care plan per patient request.  Stated pt has been diagnosed with schizoaffective disorder in past, but has not been taking medications except while incarcerated years ago. Mom reports that pt may present with odd behaviors related to voodoo practice.  Also report recent IVC 2 weeks ago for same, resulting in discharge from [facility].  Family in agreement of plan for inpatient admission for further evaluation.

## 2020-05-15 NOTE — ED Notes (Signed)
Mother: Boyd Kerbs [phone]

## 2020-05-15 NOTE — ED Notes (Signed)
Pt is awake, standing at the door.  Pt wishing to go to the bathroom.  Pt assisted to bathroom, urine obtained per Sharyl Nimrod, [place]

## 2020-05-15 NOTE — Tx Team (Signed)
Initial Treatment Plan 05/15/2020 5:48 PM [name] GNO:[id number]    PATIENT STRESSORS: Financial difficulties Legal issue Marital or family conflict Occupational concerns   PATIENT STRENGTHS: General fund of knowledge Physical Health Religious Affiliation   PATIENT IDENTIFIED PROBLEMS: Psychosis  Agitation    "I want my own place"  "I want to get my son back"             DISCHARGE CRITERIA:  Improved stabilization in mood, thinking, and/or behavior Need for constant or close observation no longer present Reduction of life-threatening or endangering symptoms to within safe limits Verbal commitment to aftercare and medication compliance  PRELIMINARY DISCHARGE PLAN: Outpatient therapy Medication management  PATIENT/FAMILY INVOLVEMENT: This treatment plan has been presented to and reviewed with the patient, Adrian Mcgrath.  The patient and family have been given the opportunity to ask questions and make suggestions.  Levin Bacon, RN 05/15/2020, 5:48 PM

## 2020-05-16 DIAGNOSIS — F250 Schizoaffective disorder, bipolar type: Secondary | ICD-10-CM | POA: Diagnosis not present

## 2020-05-16 LAB — TSH: TSH: 2.215 u[IU]/mL (ref 0.350–4.500)

## 2020-05-16 LAB — LIPID PANEL
Cholesterol: 165 mg/dL (ref 0–200)
HDL: 36 mg/dL — ABNORMAL LOW (ref >40)
LDL Cholesterol: 115 mg/dL — ABNORMAL HIGH (ref 0–99)
Total CHOL/HDL Ratio: 4.6 RATIO
Triglycerides: 69 mg/dL (ref <150)
VLDL: 14 mg/dL (ref 0–40)

## 2020-05-16 LAB — HEMOGLOBIN A1C
Hgb A1c MFr Bld: 6 % — ABNORMAL HIGH (ref 4.8–5.6)
Mean Plasma Glucose: 125.5 mg/dL

## 2020-05-16 MED ORDER — LORAZEPAM 1 MG PO TABS: 1.0000 mg | ORAL_TABLET | ORAL | Status: DC | PRN

## 2020-05-16 MED ORDER — HALOPERIDOL LACTATE 5 MG/ML IJ SOLN
10.0000 mg | Freq: Three times a day (TID) | INTRAMUSCULAR | Status: DC
  Administered 2020-05-16: 10 mg via INTRAMUSCULAR
  Filled 2020-05-16 (×9): qty 2

## 2020-05-16 MED ORDER — OLANZAPINE 10 MG PO TBDP: 10.0000 mg | ORAL_TABLET | Freq: Three times a day (TID) | ORAL | Status: DC | PRN

## 2020-05-16 MED ORDER — HALOPERIDOL 5 MG PO TABS
10.0000 mg | ORAL_TABLET | Freq: Three times a day (TID) | ORAL | Status: DC
  Administered 2020-05-16 – 2020-05-18 (×5): 10 mg via ORAL
  Filled 2020-05-16 (×10): qty 2

## 2020-05-16 MED ORDER — ZIPRASIDONE MESYLATE 20 MG IM SOLR
20.0000 mg | Freq: Two times a day (BID) | INTRAMUSCULAR | Status: DC | PRN
  Administered 2020-05-16: 20 mg via INTRAMUSCULAR

## 2020-05-16 MED ORDER — ZIPRASIDONE MESYLATE 20 MG IM SOLR
20.0000 mg | INTRAMUSCULAR | Status: DC | PRN
  Filled 2020-05-16: qty 20

## 2020-05-16 MED ORDER — RISPERIDONE 1 MG PO TBDP
1.0000 mg | ORAL_TABLET | ORAL | Status: DC
  Filled 2020-05-16: qty 1
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
## 2020-05-16 NOTE — Progress Notes (Signed)
Pt moved to the quiet room, pt appeared very paranoid , pt sitting in the doorway of the quiet room. Pt moved to 500-2 , pt pacing in the room, pt appeared to be paranoid.

## 2020-05-16 NOTE — Progress Notes (Signed)
Recreation Therapy Notes  3.11.22:  Pt was unable to be assessed due to aggressive behavior and needed medication.  LRT will attempt to assess pt at a later time.   Caroll Rancher, LRT/CTRS    Caroll Rancher A 05/16/2020 11:56 AM

## 2020-05-16 NOTE — Progress Notes (Signed)
   05/16/20 0600  Sleep  Number of Hours 5.5  CIWA-Ar  Nausea and Vomiting 0  Tactile Disturbances 0  Tremor 0  Auditory Disturbances 0  Paroxysmal Sweats 0  Visual Disturbances 0  Anxiety 4  Headache, Fullness in Head 0  Agitation 0  Orientation and Clouding of Sensorium 0  CIWA-Ar Total 4

## 2020-05-16 NOTE — Progress Notes (Signed)
Pt given PRN Tylenol per [facility] for generalized pain with HS medication

## 2020-05-16 NOTE — Progress Notes (Signed)
Pt appeared paranoid , sleeping in the doorway of the room.

## 2020-05-16 NOTE — Progress Notes (Signed)
Recreation Therapy Notes  Date: 3.11.22 Time: 1005 Location: 500 Hall Dayroom  Group Topic: Communication, Team Building, Problem Solving  Goal Area(s) Addresses:  Patient will effectively work with peer towards shared goal.  Patient will identify skills used to make activity successful.  Patient will identify how skills used during activity can be used to reach post d/c goals.   Intervention: STEM Activity  Activity: Straw Bridge. In teams of 3-5, patients were given 15 plastic drinking straws and an equal length of masking tape. Using the materials provided, patients were instructed to build a free standing bridge-like structure to suspend an everyday item (ex: puzzle box) off of the floor or table surface. All materials were required to be used by the team in their design. LRT facilitated post-activity discussion reviewing team process. Patients were encouraged to reflect how the skills used in this activity can be generalized to daily life post discharge.   Education: Pharmacist, community, Scientist, physiological, Discharge Planning   Education Outcome: Acknowledges education/In group clarification offered/Needs additional education.   Clinical Observations/Feedback: Pt did not attend group session.     Caroll Rancher, LRT/CTRS         Caroll Rancher A 05/16/2020 11:46 AM
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
## 2020-05-16 NOTE — [facility] Group Notes (Signed)
LCSW Group Therapy Note  Type of Therapy/Topic: Group Therapy: Six Dimensions of Wellness  Participation Level: Did Not Attend  Description of Group: This group will address the concept of wellness and the six concepts of wellness: occupational, physical, social, intellectual, spiritual, and emotional. Patients will be encouraged to process areas in their lives that are out of balance and identify reasons for remaining unbalanced. Patients will be encouraged to explore ways to practice healthy habits daily to attain better physical and mental health outcomes.  Therapeutic Goals: 1. Identify aspects of wellness that they are doing well. 2. Identify aspects of wellness that they would like to improve upon. 3. Identify one action they can take to improve an aspect of wellness in their lives.   Summary of Patient Progress: Did not attend.     Therapeutic Modalities: Cognitive Behavioral Therapy Solution-Focused Therapy Relapse Prevention  
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
## 2020-05-16 NOTE — Progress Notes (Signed)
Pt. Was going in and our of pt.s' rooms, threatening and agitated. Pt. Took off his shirt and laid on the floor and willingly took 50mg  of Benadryl IM, 20 mg of Geodon IM, and 1 mg of Ativan IM.  Pt. Stated that he was "sorry".

## 2020-05-16 NOTE — Tx Team (Signed)
Interdisciplinary Treatment and Diagnostic Plan Update  05/16/2020 Time of Session: 10:50am Adrian Mcgrath MRN: [id number]  Principal Diagnosis: <principal problem not specified>  Secondary Diagnoses: Active Problems:   Schizoaffective disorder ([place])   Current Medications:  Current Facility-Administered Medications  Medication Dose Route Frequency Provider Last Rate Last Admin  . acetaminophen (TYLENOL) tablet 650 mg  650 mg Oral Q6H PRN Sharma Covert, MD   650 mg at 05/15/20 2015  . alum & mag hydroxide-simeth (MAALOX/MYLANTA) 200-200-20 MG/5ML suspension 30 mL  30 mL Oral Q4H PRN Sharma Covert, MD      . diphenhydrAMINE (BENADRYL) capsule 50 mg  50 mg Oral BID PRN Sharma Covert, MD       Or  . diphenhydrAMINE (BENADRYL) injection 50 mg  50 mg Intramuscular Q6H PRN Sharma Covert, MD   50 mg at 05/16/20 0941  . haloperidol (HALDOL) tablet 10 mg  10 mg Oral TID Sharma Covert, MD   10 mg at 05/16/20 1004   Or  . haloperidol lactate (HALDOL) injection 10 mg  10 mg Intramuscular TID Sharma Covert, MD      . hydrOXYzine (ATARAX/VISTARIL) tablet 25 mg  25 mg Oral Q6H PRN Sharma Covert, MD      . loperamide (IMODIUM) capsule 2-4 mg  2-4 mg Oral PRN Sharma Covert, MD      . LORazepam (ATIVAN) tablet 1 mg  1 mg Oral BID PRN Sharma Covert, MD       Or  . LORazepam (ATIVAN) injection 1 mg  1 mg Intramuscular BID PRN Sharma Covert, MD   1 mg at 05/16/20 0941  . LORazepam (ATIVAN) tablet 1 mg  1 mg Oral Q6H PRN Sharma Covert, MD      . OLANZapine zydis [facility]) disintegrating tablet 10 mg  10 mg Oral Q8H PRN Sharma Covert, MD       And  . LORazepam (ATIVAN) tablet 1 mg  1 mg Oral PRN Sharma Covert, MD       And  . ziprasidone (GEODON) injection 20 mg  20 mg Intramuscular PRN Sharma Covert, MD      . magnesium hydroxide (MILK OF MAGNESIA) suspension 30 mL  30 mL Oral Daily PRN Sharma Covert, MD   30 mL at 05/16/20  0233  . multivitamin with minerals tablet 1 tablet  1 tablet Oral Daily Sharma Covert, MD   1 tablet at 05/16/20 [phone]  . ondansetron (ZOFRAN-ODT) disintegrating tablet 4 mg  4 mg Oral Q6H PRN Sharma Covert, MD      . paliperidone (INVEGA) 24 hr tablet 6 mg  6 mg Oral QHS Sharma Covert, MD   6 mg at 05/15/20 2015  . thiamine tablet 100 mg  100 mg Oral Daily Sharma Covert, MD   100 mg at 05/16/20 8101  . traZODone (DESYREL) tablet 50 mg  50 mg Oral QHS PRN Sharma Covert, MD      . ziprasidone (GEODON) injection 20 mg  20 mg Intramuscular Q12H PRN Sharma Covert, MD   20 mg at 05/16/20 0941   PTA Medications: Medications Prior to Admission  Medication Sig Dispense Refill Last Dose  . albuterol (VENTOLIN HFA) 108 (90 Base) MCG/ACT inhaler Inhale 2 puffs into the lungs every 4 (four) hours as needed for wheezing or shortness of breath. 6.7 g 1   . metroNIDAZOLE (FLAGYL) 500 MG tablet Take 1 tablet (500  mg total) by mouth 2 (two) times daily. 14 tablet 0     Patient Stressors: Arts development officer issue Marital or family conflict Occupational concerns  Patient Strengths: General fund of knowledge Physical Health Religious Affiliation  Treatment Modalities: Medication Management, Group therapy, Case management,  1 to 1 session with clinician, Psychoeducation, Recreational therapy.   Physician Treatment Plan for Primary Diagnosis: <principal problem not specified> Long Term Goal(s):     Short Term Goals:    Medication Management: Evaluate patient's response, side effects, and tolerance of medication regimen.  Therapeutic Interventions: 1 to 1 sessions, Unit Group sessions and Medication administration.  Evaluation of Outcomes: Not Met  Physician Treatment Plan for Secondary Diagnosis: Active Problems:   Schizoaffective disorder ([place])  Long Term Goal(s):     Short Term Goals:       Medication Management: Evaluate patient's response, side  effects, and tolerance of medication regimen.  Therapeutic Interventions: 1 to 1 sessions, Unit Group sessions and Medication administration.  Evaluation of Outcomes: Not Met   RN Treatment Plan for Primary Diagnosis: <principal problem not specified> Long Term Goal(s): Knowledge of disease and therapeutic regimen to maintain health will improve  Short Term Goals: Ability to remain free from injury will improve, Ability to demonstrate self-control and Ability to participate in decision making will improve  Medication Management: RN will administer medications as ordered by provider, will assess and evaluate patient's response and provide education to patient for prescribed medication. RN will report any adverse and/or side effects to prescribing provider.  Therapeutic Interventions: 1 on 1 counseling sessions, Psychoeducation, Medication administration, Evaluate responses to treatment, Monitor vital signs and CBGs as ordered, Perform/monitor CIWA, COWS, AIMS and Fall Risk screenings as ordered, Perform wound care treatments as ordered.  Evaluation of Outcomes: Not Met   LCSW Treatment Plan for Primary Diagnosis: <principal problem not specified> Long Term Goal(s): Safe transition to appropriate next level of care at discharge, Engage patient in therapeutic group addressing interpersonal concerns.  Short Term Goals: Engage patient in aftercare planning with referrals and resources, Increase social support and Increase ability to appropriately verbalize feelings  Therapeutic Interventions: Assess for all discharge needs, 1 to 1 time with Social worker, Explore available resources and support systems, Assess for adequacy in community support network, Educate family and significant other(s) on suicide prevention, Complete Psychosocial Assessment, Interpersonal group therapy.  Evaluation of Outcomes: Not Met   Progress in Treatment: Attending groups: No. Participating in groups: No. Taking  medication as prescribed: No. Toleration medication: No. Family/Significant other contact made: No, will contact:  CSW will obtain consents Patient understands diagnosis: No. Discussing patient identified problems/goals with staff: No. Medical problems stabilized or resolved: No. Denies suicidal/homicidal ideation: Yes. Issues/concerns per patient self-inventory: No. Other: None  New problem(s) identified: No, Describe:  None  New Short Term/Long Term Goal(s):medication stabilization, elimination of SI thoughts, development of comprehensive mental wellness plan.  Patient Goals:  Pt was sleeping due to medications.   Discharge Plan or Barriers: Patient recently admitted. CSW will continue to follow and assess for appropriate referrals and possible discharge planning.  Reason for Continuation of Hospitalization: Delusions  Hallucinations Medication stabilization  Estimated Length of Stay: 3-5 days   Attendees: Patient:Pt was sleeping.  05/16/2020   Physician: Myles Lipps, MD 05/16/2020   Nursing:  05/16/2020   RN Care Manager: 05/16/2020   Social Worker: Toney Reil, [place] 05/16/2020   Recreational Therapist:  05/16/2020   Other:  05/16/2020   Other:  05/16/2020  Other: 05/16/2020       Scribe for Treatment Team: Mliss Fritz, Latanya Presser 05/16/2020 11:25 AM

## 2020-05-16 NOTE — Progress Notes (Signed)
   05/16/20 0553  Vital Signs  Temp 98.5 F (36.9 C)  Temp Source Oral  Pulse Rate 64  BP 133/72  BP Location Right Arm  BP Method Automatic  Patient Position (if appropriate) Sitting  Oxygen Therapy  SpO2 100 %   D: Patient denies SI/HI/AVH. Patient denies both anxiety and depression. Patient stated that "I am ready to go" Patient was going in and out of other patient rooms and became agitated and labile.  A:  Patient refused to take Risperdal, stating that it gives him a rash. Pt. Agreed to take 1 mg of ativan IM, 50 mg of Benadryl and 20 mg of Geodon IM later in the morning d/t agitation. Pt. Has been calm since injections. Support and encouragement provided Routine safety checks conducted every 15 minutes. Patient  Informed to notify staff with any concerns.   R: Safety maintained.
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
## 2020-05-16 NOTE — Progress Notes (Signed)
   05/16/20 2000  Psych Admission Type (Psych Patients Only)  Admission Status Involuntary  Psychosocial Assessment  Patient Complaints Anxiety  Eye Contact Brief  Facial Expression Angry  Affect Angry;Irritable  Speech Tangential;Argumentative  Interaction Defensive;Forwards little;Guarded;Minimal  Motor Activity Fidgety  Appearance/Hygiene Unremarkable;In scrubs  Behavior Characteristics Cooperative;Agressive verbally  Mood Anxious;Depressed  Thought Process  Coherency Tangential;Disorganized;Flight of ideas  Content Preoccupation;Paranoia;Blaming others  Delusions Paranoid  Perception WDL (pt denied-did not appear to be responding to internal stimuli)  Hallucination None reported or observed (pt denied-did not appear to be responding to internal stimuli)  Judgment Impaired  Confusion Mild  Danger to Self  Current suicidal ideation? Denies  Danger to Others  Danger to Others None reported or observed

## 2020-05-16 NOTE — Progress Notes (Signed)
[facility] Second Physician Opinion Progress Note for Medication Administration to Non-consenting Patients (For Involuntarily Committed Patients)  Patient: Adrian Mcgrath Date of Birth: [birth year] MRN: [id number]   Patient seen and briefly interviewed.  Medical record reviewed.  Case discussed in detail with nursing staff and the treating physician.  Adrian Mcgrath is a 30 year old male with a history of schizoaffective disorder who had been noncompliant with treatment as an outpatient and who was recently admitted to [facility] on IVC petition from [facility].  Patient was taken to the emergency department by EMS after he was found at a gas station hallucinating and displaying bizarre and aggressive behavior.  Per [facility] inpatient nursing staff report, the patient has been irritable, agitated, threatening, paranoid and pacing since admission.  This morning patient reportedly was highly agitated and verbally aggressive.  The patient made threats to assault and kill staff.  He has been going into the rooms of peers.  He has been slamming doors of rooms to peers.  He required emergent medication for aggressive threatening behavior this morning.  I saw the patient this morning after he had received IM medications.  He was resting in his room and did not participate in the interview.  It is felt that without medication the patient's condition will deteriorate and he is likely to continue with aggressive behavior that could result in harm to others or patient.  Reason for the Medication: The patient, without the benefit of the specific treatment measure, is incapable of participating in any available treatment plan that will give the patient a realistic opportunity of improving the patient's condition. There is, without the benefit of the specific treatment measure, a significant possibility that the patient will harm self or others before improvement of the patient's condition is realized.  Consideration of  Side Effects: Consideration of the side effects related to the medication plan has been given.  Rationale for Medication Administration: Without forced medication the patient's condition will deteriorate, the hospitalization will be prolonged, and the patient will experience increased disability.  Additionally without forced medication it is also reasonably likely that patient will continue to exhibit aggressive behavior which would cause harm to others.    Claudie Revering, MD 05/16/20  11:25 AM   This documentation is good for (7) seven days from the date of the MD signature. New documentation must be completed every seven (7) days with detailed justification in the medical record if the patient requires continued non-emergent administration of psychotropic medications.

## 2020-05-17 DIAGNOSIS — F250 Schizoaffective disorder, bipolar type: Secondary | ICD-10-CM | POA: Diagnosis not present

## 2020-05-17 MED ORDER — PALIPERIDONE ER 3 MG PO TB24
9.0000 mg | ORAL_TABLET | Freq: Every day | ORAL | Status: DC
  Administered 2020-05-17: 9 mg via ORAL
  Filled 2020-05-17 (×3): qty 3

## 2020-05-17 MED ORDER — HYDROCORTISONE 0.5 % EX CREA
TOPICAL_CREAM | Freq: Two times a day (BID) | CUTANEOUS | Status: DC
  Administered 2020-05-17: 1 via TOPICAL
  Filled 2020-05-17: qty 28.35

## 2020-05-17 NOTE — Progress Notes (Signed)
Adult Psychoeducational Group Note  Date:  05/17/2020 Time:  8:49 PM  Group Topic/Focus:  Wrap-Up Group:   The focus of this group is to help patients review their daily goal of treatment and discuss progress on daily workbooks.  Participation Level:  Did Not Attend    Adrian Mcgrath 05/17/2020, 8:49 PM

## 2020-05-17 NOTE — Progress Notes (Signed)
   05/17/20 2100  Psych Admission Type (Psych Patients Only)  Admission Status Involuntary  Psychosocial Assessment  Patient Complaints None  Eye Contact Brief  Facial Expression Flat  Affect Appropriate to circumstance  Speech Logical/coherent  Interaction Isolative  Motor Activity Slow  Appearance/Hygiene In hospital gown  Behavior Characteristics Cooperative;Calm  Mood Pleasant  Thought Process  Coherency Unable to assess  Content UTA  Delusions None reported or observed  Perception WDL (pt denied-did not appear to be responding to internal stimuli)  Hallucination None reported or observed (pt denied-did not appear to be responding to internal stimuli)  Judgment UTA  Confusion UTA  Danger to Self  Current suicidal ideation? Denies  Danger to Others  Danger to Others None reported or observed

## 2020-05-17 NOTE — Progress Notes (Signed)
   05/17/20 2045  COVID-19 Daily Checkoff  Have you had a fever (temp > 37.80C/100F)  in the past 24 hours?  No  If you have had runny nose, nasal congestion, sneezing in the past 24 hours, has it worsened? No  COVID-19 EXPOSURE  Have you traveled outside the state in the past 14 days? No  Have you been in contact with someone with a confirmed diagnosis of COVID-19 or PUI in the past 14 days without wearing appropriate PPE? No  Have you been living in the same home as a person with confirmed diagnosis of COVID-19 or a PUI (household contact)? No  Have you been diagnosed with COVID-19? No

## 2020-05-17 NOTE — Progress Notes (Signed)
   05/17/20 1300  Psych Admission Type (Psych Patients Only)  Admission Status Involuntary  Psychosocial Assessment  Patient Complaints Anxiety  Eye Contact Brief  Facial Expression Angry  Affect Angry;Irritable  Speech Tangential;Argumentative  Interaction Defensive;Forwards little;Guarded;Minimal  Motor Activity Fidgety  Appearance/Hygiene Unremarkable;In scrubs  Behavior Characteristics Cooperative  Mood Anxious  Thought Process  Coherency Tangential;Disorganized;Flight of ideas  Content Preoccupation;Paranoia;Blaming others  Delusions Paranoid  Perception WDL (pt denied-did not appear to be responding to internal stimuli)  Hallucination None reported or observed (pt denied-did not appear to be responding to internal stimuli)  Judgment Impaired  Confusion Mild  Danger to Self  Current suicidal ideation? Denies  Danger to Others  Danger to Others None reported or observed  Dar Note: Patient presents with a blunted affect and anxious mood.  Denies suicidal thoughts, auditory and visual hallucinations.  Medication given as prescribed.  Routine safety checks maintained.  Patient is safe on and off the unit.

## 2020-05-17 NOTE — Progress Notes (Signed)
   05/17/20 0607  Vital Signs  Pulse Rate (!) 115  Pulse Rate Source Monitor  BP (!) 116/59  BP Location Right Arm  BP Method Automatic  Patient Position (if appropriate) Standing  Oxygen Therapy  SpO2 98 %  Sleep  Number of Hours 8.5

## 2020-05-17 NOTE — [facility] Group Notes (Signed)
.  Psychoeducational Group Note  Date: 05-17-2020 Time: 0900-1000    Goal Setting   Purpose of Group: This group helps to provide patients with the steps of setting a goal that is specific, measurable, attainable, realistic and time specific. A discussion on how we keep ourselves stuck with negative self talk.    Participation Level:  Active  Participation Quality:  Appropriate  Affect:  Appropriate  Cognitive:  Appropriate  Insight:  Improving  Engagement in Group:  Engaged  Additional Comments:  Rates his energy at 9/10. Participated fully in the group. Did get up and walk out several times.  Dione Housekeeper

## 2020-05-17 NOTE — BHH Group Notes (Signed)
LCSW Group Therapy Note  05/17/2020    11:00am-12:00pm  Type of Therapy and Topic:  Group Therapy: Anger - Unhealthy versus Healthy Coping Skills  Participation Level:  Active   Description of Group:   In this group, patients acknowledged that there is a continuum of anger from "mildly irritated" to "murderous rage."  They identified how they usually or often react when angered.  They learned how healthy and unhealthy coping skills all work initially, but the unhealthy ones stop working while the healthy ones remain helpful.   They analyzed how their frequently-chosen coping skill is possibly beneficial and how it is possibly unhelpful.  The group discussed a variety of healthier coping skills that could help in resolving the actual issues, as well as how to go about planning for the the possibility of future similar situations.  Patients then individually identified one healthy coping skill that they would be willing to try.  Therapeutic Goals: . Patients will identify one thing that makes them angry and how they feel emotionally and physically, what their thoughts are or tend to be in those situations, and what healthy or unhealthy coping mechanism they typically use . Patients will identify how their coping technique works for them, as well as how it works against them. . Patients will explore possible new behaviors to use in future anger situations and decide on one technique they will try in the future . Patients will learn that anger itself is normal and cannot be eliminated, and that healthier coping skills can assist with resolving conflict rather than worsening situations.  Summary of Patient Progress:  The patient shared that when he is angry, he often chooses to cope with these feelings by trying to hide his feelings, but he acknowledged that this is usually unsuccessful as people can tell how he feels anyway.  The patient sees this coping skill as unhealthy and stated "expressing  yourself is better."  Therapeutic Modalities:   Cognitive Behavioral Therapy Motivation Interviewing  Lynnell Chad, LCSW
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
## 2020-05-18 DIAGNOSIS — F250 Schizoaffective disorder, bipolar type: Principal | ICD-10-CM

## 2020-05-18 MED ORDER — PALIPERIDONE PALMITATE ER 234 MG/1.5ML IM SUSY
234.0000 mg | PREFILLED_SYRINGE | INTRAMUSCULAR | Status: AC
  Administered 2020-05-18: 234 mg via INTRAMUSCULAR

## 2020-05-18 MED ORDER — HALOPERIDOL 5 MG PO TABS
5.0000 mg | ORAL_TABLET | Freq: Two times a day (BID) | ORAL | 0 refills | Status: DC
Start: 2020-05-18 — End: 2020-06-04

## 2020-05-18 MED ORDER — PALIPERIDONE PALMITATE ER 234 MG/1.5ML IM SUSY
234.0000 mg | PREFILLED_SYRINGE | Freq: Once | INTRAMUSCULAR | Status: DC
  Filled 2020-05-18: qty 1.5

## 2020-05-18 MED ORDER — PALIPERIDONE PALMITATE ER 234 MG/1.5ML IM SUSY
234.0000 mg | PREFILLED_SYRINGE | INTRAMUSCULAR | Status: DC
  Filled 2020-05-18: qty 1.5

## 2020-05-18 MED ORDER — PALIPERIDONE ER 9 MG PO TB24: 9.0000 mg | ORAL_TABLET | Freq: Every day | ORAL | 0 refills | Status: DC

## 2020-05-18 MED ORDER — HALOPERIDOL 5 MG PO TABS
10.0000 mg | ORAL_TABLET | Freq: Two times a day (BID) | ORAL | Status: DC
  Filled 2020-05-18 (×4): qty 2

## 2020-05-18 MED ORDER — HALOPERIDOL LACTATE 5 MG/ML IJ SOLN
10.0000 mg | Freq: Two times a day (BID) | INTRAMUSCULAR | Status: DC
  Filled 2020-05-18 (×4): qty 2

## 2020-05-18 MED ORDER — HALOPERIDOL 5 MG PO TABS
5.0000 mg | ORAL_TABLET | Freq: Two times a day (BID) | ORAL | Status: DC
  Filled 2020-05-18 (×2): qty 1
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
## 2020-05-18 NOTE — Progress Notes (Addendum)
D:  Patient denied SI and HI, contracts for safety.  Denied A/V hallucinations.  Denied pain. A:  Medications administered per MD orders.  Emotional support and encouragement given patient. R:  Safety maintained with 15 minute checks.  

## 2020-05-18 NOTE — Progress Notes (Signed)
Discharge Note:  Patient discharged home with family member.  Patient denied SI and HI.  Denied A/V hallucinations.  Denied pain.  Suicide prevention information given and discussed with patient who stated he understood and had no questions.  Patient stated he appreciated all assistance received from [facility] staff.  Patient stated he received all his belongings, clothing, toiletries, misc items, etc.  All required discharge information given at discharge.

## 2020-05-18 NOTE — Plan of Care (Signed)
Nurse discussed coping skills with patient.  
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
## 2020-05-18 NOTE — BHH Group Notes (Addendum)
[facility] LCSW Group Therapy Note  Date/Time:  05/18/2020  11:00AM-12:00PM  Type of Therapy and Topic:  Group Therapy:  Music and Mood  Participation Level:  Active   Description of Group: In this process group, members listened to a variety of genres of music and identified that different types of music evoke different responses.  Patients were encouraged to identify music that was soothing for them and music that was energizing for them.  Patients discussed how this knowledge can help with wellness and recovery in various ways including managing depression and anxiety as well as encouraging healthy sleep habits.    Therapeutic Goals: 1. Patients will explore the impact of different varieties of music on mood 2. Patients will verbalize the thoughts they have when listening to different types of music 3. Patients will identify music that is soothing to them as well as music that is energizing to them 4. Patients will discuss how to use this knowledge to assist in maintaining wellness and recovery 5. Patients will explore the use of music as a coping skill  Summary of Patient Progress:  At the beginning of group, patient expressed that he felt "wonderful."  He danced and sang very enthusiastically throughout group.  At the end of group, patient expressed that he felt "calm cool, and collected."  Therapeutic Modalities: Solution Focused Brief Therapy Activity   Ambrose Mantle, LCSW

## 2020-05-18 NOTE — BHH Suicide Risk Assessment (Signed)
[facility] Discharge Suicide Risk Assessment   Principal Problem: <principal problem not specified> Discharge Diagnoses: Active Problems:   Schizoaffective disorder (HCC)   Total Time spent with patient: 20 minutes  Musculoskeletal: Strength & Muscle Tone: within normal limits Gait & Station: normal Patient leans: N/A  Psychiatric Specialty Exam: Review of Systems  All other systems reviewed and are negative.   Blood pressure 127/72, pulse 92, temperature 97.9 F (36.6 C), temperature source Oral, resp. rate 18, height 5' 6.5" (1.689 m), weight 68 kg, SpO2 100 %.Body mass index is 23.85 kg/m.  General Appearance: Fairly Groomed  Patent attorney::  Good  Speech:  Normal Rate409  Volume:  Normal  Mood:  Anxious  Affect:  Congruent  Thought Process:  Coherent and Descriptions of Associations: Intact  Orientation:  Full (Time, Place, and Person)  Thought Content:  Logical  Suicidal Thoughts:  No  Homicidal Thoughts:  No  Memory:  Immediate;   Fair Recent;   Fair Remote;   Fair  Judgement:  Intact  Insight:  Fair  Psychomotor Activity:  Increased  Concentration:  Fair  Recall:  [employer] of Knowledge:Good  Language: Good  Akathisia:  Negative  Handed:  Right  AIMS (if indicated):     Assets:  Desire for Improvement Housing Resilience Social Support Transportation Vocational/Educational  Sleep:  Number of Hours: 8.75  Cognition: WNL  ADL's:  Intact   Mental Status Per Nursing Assessment::   On Admission:  NA  Demographic Factors:  Male and Low socioeconomic status  Loss Factors: Legal issues  Historical Factors: Family history of mental illness or substance abuse and Impulsivity  Risk Reduction Factors:   Sense of responsibility to family, Living with another person, especially a relative and Positive social support  Continued Clinical Symptoms:  Alcohol/Substance Abuse/Dependencies Schizophrenia:   Less than 27 years old Paranoid or undifferentiated  type  Cognitive Features That Contribute To Risk:  None    Suicide Risk:  Minimal: No identifiable suicidal ideation.  Patients presenting with no risk factors but with morbid ruminations; may be classified as minimal risk based on the severity of the depressive symptoms   Follow-up Information    [facility]. Go on 05/26/2020.   Specialty: Behavioral Health Why: You have a walk in appointment on 05/26/20 at 7:45 am for therapy services.  You also have a walk in appointment on 06/09/20 at 7:45 am for medication management.  Walk in appointments are first come, first served and are held in person.  Contact information: [address] [place] [address] [phone]              Plan Of Care/Follow-up recommendations:  Activity:  ad lib Other:  take medications as directed. You will need your second Invega shot in 7 days. Do not use drugs or alcohol  Antonieta Pert, MD 05/18/2020, 1:32 PM

## 2020-05-27 ENCOUNTER — Ambulatory Visit (HOSPITAL_COMMUNITY): Payer: Federal, State, Local not specified - Other | Admitting: Physician Assistant

## 2020-05-28 ENCOUNTER — Encounter (HOSPITAL_COMMUNITY): Payer: Federal, State, Local not specified - Other | Admitting: Psychiatry

## 2020-06-04 ENCOUNTER — Ambulatory Visit (INDEPENDENT_AMBULATORY_CARE_PROVIDER_SITE_OTHER): Payer: No Payment, Other | Admitting: Psychiatry

## 2020-06-04 ENCOUNTER — Ambulatory Visit (INDEPENDENT_AMBULATORY_CARE_PROVIDER_SITE_OTHER): Payer: No Payment, Other | Admitting: Clinical

## 2020-06-04 ENCOUNTER — Ambulatory Visit (HOSPITAL_COMMUNITY): Payer: No Payment, Other | Admitting: *Deleted

## 2020-06-04 ENCOUNTER — Encounter (HOSPITAL_COMMUNITY): Payer: Self-pay | Admitting: Psychiatry

## 2020-06-04 ENCOUNTER — Other Ambulatory Visit: Payer: Self-pay

## 2020-06-04 VITALS — BP 136/75 | HR 76 | Ht 66.5 in | Wt 160.0 lb

## 2020-06-04 DIAGNOSIS — F250 Schizoaffective disorder, bipolar type: Secondary | ICD-10-CM

## 2020-06-04 MED ORDER — PALIPERIDONE PALMITATE ER 156 MG/ML IM SUSY
156.0000 mg | PREFILLED_SYRINGE | INTRAMUSCULAR | Status: DC
  Administered 2020-06-04: 156 mg via INTRAMUSCULAR

## 2020-06-04 NOTE — Progress Notes (Signed)
In as a walk in for his injection. He has not been seen here by a provider but recently out of the behavioral [facility]. Seen by Dr Evelene Croon and order put in for his injection. He got Invega S 156 mg in his R gluteal muscle without difficulty. He states he doesn't like getting a shot. He is involved with the court and needs to show proof he followed up here. He is pleasant and cooperative. Currently living with his "baby sister" and just got a new job at a [employer]. He will return in one month for his next injection.
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
## 2020-06-20 ENCOUNTER — Encounter (HOSPITAL_COMMUNITY): Payer: Federal, State, Local not specified - Other | Admitting: Physician Assistant

## 2020-06-26 ENCOUNTER — Encounter (HOSPITAL_COMMUNITY): Payer: No Payment, Other | Admitting: Physician Assistant

## 2020-07-02 ENCOUNTER — Ambulatory Visit (INDEPENDENT_AMBULATORY_CARE_PROVIDER_SITE_OTHER): Payer: No Payment, Other | Admitting: *Deleted

## 2020-07-02 ENCOUNTER — Telehealth (INDEPENDENT_AMBULATORY_CARE_PROVIDER_SITE_OTHER): Payer: No Payment, Other | Admitting: Psychiatry

## 2020-07-02 ENCOUNTER — Other Ambulatory Visit: Payer: Self-pay

## 2020-07-02 ENCOUNTER — Encounter (HOSPITAL_COMMUNITY): Payer: Self-pay

## 2020-07-02 ENCOUNTER — Encounter (HOSPITAL_COMMUNITY): Payer: Self-pay | Admitting: Psychiatry

## 2020-07-02 VITALS — BP 135/82 | HR 79 | Ht 66.5 in | Wt 167.0 lb

## 2020-07-02 DIAGNOSIS — F250 Schizoaffective disorder, bipolar type: Secondary | ICD-10-CM
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
## 2020-07-02 NOTE — Progress Notes (Signed)
Patient arrived for [facility] 156 mg. Patient is pleasant. Tolerated Injection well in Left-Gluteal. NO HI/SI NOR AH/VH

## 2020-07-09 ENCOUNTER — Other Ambulatory Visit: Payer: Self-pay

## 2020-07-09 ENCOUNTER — Ambulatory Visit (INDEPENDENT_AMBULATORY_CARE_PROVIDER_SITE_OTHER): Payer: No Payment, Other | Admitting: Clinical

## 2020-07-09 DIAGNOSIS — F250 Schizoaffective disorder, bipolar type: Secondary | ICD-10-CM | POA: Diagnosis not present
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
## 2020-08-05 ENCOUNTER — Telehealth (HOSPITAL_COMMUNITY): Payer: Self-pay | Admitting: Clinical

## 2020-08-05 ENCOUNTER — Ambulatory Visit (HOSPITAL_COMMUNITY): Payer: Self-pay | Admitting: Clinical

## 2020-08-05 NOTE — Telephone Encounter (Signed)
Pt request return call from therapist Idalia Needle. Message to therapist to call pt at [phone].

## 2020-08-08 ENCOUNTER — Telehealth (INDEPENDENT_AMBULATORY_CARE_PROVIDER_SITE_OTHER): Payer: No Payment, Other | Admitting: Physician Assistant

## 2020-08-08 DIAGNOSIS — F250 Schizoaffective disorder, bipolar type: Secondary | ICD-10-CM | POA: Diagnosis not present

## 2020-08-09 ENCOUNTER — Encounter (HOSPITAL_COMMUNITY): Payer: Self-pay | Admitting: Physician Assistant
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
## 2020-08-26 ENCOUNTER — Ambulatory Visit (INDEPENDENT_AMBULATORY_CARE_PROVIDER_SITE_OTHER): Payer: No Payment, Other | Admitting: Clinical

## 2020-08-26 DIAGNOSIS — F250 Schizoaffective disorder, bipolar type: Secondary | ICD-10-CM
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
## 2020-09-26 ENCOUNTER — Telehealth (HOSPITAL_COMMUNITY): Payer: Self-pay | Admitting: Physician Assistant

## 2020-09-26 NOTE — Telephone Encounter (Signed)
Provider has acknowledged and reviewed message.

## 2020-09-26 NOTE — Telephone Encounter (Signed)
Patient was contacted to schedule follow-up with provider. Patient declined services.

## 2020-10-16 ENCOUNTER — Encounter (HOSPITAL_COMMUNITY): Payer: Self-pay | Admitting: Emergency Medicine

## 2020-10-16 ENCOUNTER — Ambulatory Visit (HOSPITAL_COMMUNITY)
Admission: EM | Admit: 2020-10-16 | Discharge: 2020-10-16 | Disposition: A | Discharge status: Home / Self Care | Payer: Self-pay | Attending: Emergency Medicine | Admitting: Emergency Medicine

## 2020-10-16 ENCOUNTER — Other Ambulatory Visit: Payer: Self-pay

## 2020-10-16 DIAGNOSIS — Z113 Encounter for screening for infections with a predominantly sexual mode of transmission: Secondary | ICD-10-CM

## 2020-10-16 LAB — RPR: RPR Ser Ql: NONREACTIVE

## 2020-10-16 LAB — HIV ANTIBODY (ROUTINE TESTING W REFLEX): HIV Screen 4th Generation wRfx: NONREACTIVE

## 2020-10-16 NOTE — Discharge Instructions (Addendum)
Labs pending 2-3 days, you will be contacted if positive for any sti and treatment will be sent to the pharmacy, you will have to return to the clinic if positive for gonorrhea to receive treatment   Please refrain from having sex until labs results, if positive please refrain from having sex until treatment complete and symptoms resolve   If positive for HIV, Syphilis, Chlamydia  gonorrhea or trichomoniasis please notify partner or partners so they may tested as well  Moving forward, it is recommended you use some form of protection against the transmission of sti infections  such as condoms or dental dams with each sexual encounter   

## 2020-10-16 NOTE — ED Triage Notes (Signed)
Patient requesting std check.  Denies any symptoms
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
## 2020-10-17 LAB — CYTOLOGY, (ORAL, ANAL, URETHRAL) ANCILLARY ONLY
Chlamydia: NEGATIVE
Comment: NEGATIVE
Comment: NEGATIVE
Comment: NORMAL
Neisseria Gonorrhea: NEGATIVE
Trichomonas: NEGATIVE

## 2020-11-19 ENCOUNTER — Ambulatory Visit (HOSPITAL_COMMUNITY): Payer: No Payment, Other | Admitting: Clinical

## 2021-09-09 ENCOUNTER — Ambulatory Visit (HOSPITAL_COMMUNITY)
Admission: EM | Admit: 2021-09-09 | Discharge: 2021-09-09 | Disposition: A | Discharge status: Home / Self Care | Payer: Self-pay | Attending: Physician Assistant | Admitting: Physician Assistant

## 2021-09-09 ENCOUNTER — Encounter (HOSPITAL_COMMUNITY): Payer: Self-pay | Admitting: Emergency Medicine

## 2021-09-09 DIAGNOSIS — Z113 Encounter for screening for infections with a predominantly sexual mode of transmission: Secondary | ICD-10-CM | POA: Insufficient documentation

## 2021-09-09 DIAGNOSIS — Z202 Contact with and (suspected) exposure to infections with a predominantly sexual mode of transmission: Secondary | ICD-10-CM

## 2021-09-09 LAB — HIV ANTIBODY (ROUTINE TESTING W REFLEX): HIV Screen 4th Generation wRfx: NONREACTIVE
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
## 2021-09-09 NOTE — ED Triage Notes (Signed)
Patient presents for STD testing.   Patient denies any symptoms or known exposures.

## 2021-09-09 NOTE — Discharge Instructions (Signed)
We will contact you if any of your testing is positive and we need to arrange treatment.  Please abstain from sex until you receive your results.  You should use a condom with each sexual encounter.  If you develop any symptoms including penile discharge, burning when you pee, abdominal pain, fever, nausea, vomiting you should be seen immediately.

## 2021-09-10 LAB — CYTOLOGY, (ORAL, ANAL, URETHRAL) ANCILLARY ONLY
Chlamydia: NEGATIVE
Comment: NEGATIVE
Comment: NEGATIVE
Comment: NORMAL
Neisseria Gonorrhea: NEGATIVE
Trichomonas: NEGATIVE

## 2021-09-10 LAB — RPR: RPR Ser Ql: NONREACTIVE

## 2022-02-28 ENCOUNTER — Ambulatory Visit (HOSPITAL_COMMUNITY)
Admission: EM | Admit: 2022-02-28 | Discharge: 2022-02-28 | Disposition: A | Discharge status: Home / Self Care | Payer: Self-pay | Attending: Internal Medicine | Admitting: Internal Medicine

## 2022-02-28 ENCOUNTER — Encounter (HOSPITAL_COMMUNITY): Payer: Self-pay | Admitting: *Deleted

## 2022-02-28 ENCOUNTER — Other Ambulatory Visit: Payer: Self-pay

## 2022-02-28 DIAGNOSIS — R059 Cough, unspecified: Secondary | ICD-10-CM | POA: Insufficient documentation

## 2022-02-28 DIAGNOSIS — R509 Fever, unspecified: Secondary | ICD-10-CM | POA: Insufficient documentation

## 2022-02-28 DIAGNOSIS — Z1152 Encounter for screening for COVID-19: Secondary | ICD-10-CM | POA: Insufficient documentation

## 2022-02-28 DIAGNOSIS — J111 Influenza due to unidentified influenza virus with other respiratory manifestations: Secondary | ICD-10-CM | POA: Insufficient documentation

## 2022-02-28 DIAGNOSIS — J069 Acute upper respiratory infection, unspecified: Secondary | ICD-10-CM

## 2022-02-28 DIAGNOSIS — J45909 Unspecified asthma, uncomplicated: Secondary | ICD-10-CM | POA: Insufficient documentation

## 2022-02-28 LAB — POC INFLUENZA A AND B ANTIGEN (URGENT CARE ONLY)
INFLUENZA A ANTIGEN, POC: NEGATIVE
INFLUENZA B ANTIGEN, POC: NEGATIVE

## 2022-02-28 MED ORDER — IBUPROFEN 800 MG PO TABS
ORAL_TABLET | ORAL | Status: AC
  Filled 2022-02-28: qty 1

## 2022-02-28 MED ORDER — BENZONATATE 100 MG PO CAPS: 100.0000 mg | ORAL_CAPSULE | Freq: Three times a day (TID) | ORAL | 0 refills | Status: DC

## 2022-02-28 MED ORDER — ALBUTEROL SULFATE HFA 108 (90 BASE) MCG/ACT IN AERS
2.0000 | INHALATION_SPRAY | Freq: Once | RESPIRATORY_TRACT | Status: AC
  Administered 2022-02-28: 2 via RESPIRATORY_TRACT

## 2022-02-28 MED ORDER — ACETAMINOPHEN 325 MG PO TABS
975.0000 mg | ORAL_TABLET | Freq: Once | ORAL | Status: AC
  Administered 2022-02-28: 975 mg via ORAL

## 2022-02-28 MED ORDER — ACETAMINOPHEN 325 MG PO TABS
ORAL_TABLET | ORAL | Status: AC
  Filled 2022-02-28: qty 3

## 2022-02-28 MED ORDER — IBUPROFEN 800 MG PO TABS
800.0000 mg | ORAL_TABLET | Freq: Once | ORAL | Status: AC
  Administered 2022-02-28: 800 mg via ORAL

## 2022-02-28 MED ORDER — ALBUTEROL SULFATE HFA 108 (90 BASE) MCG/ACT IN AERS: 1.0000 | INHALATION_SPRAY | RESPIRATORY_TRACT | 0 refills | Status: DC | PRN

## 2022-02-28 MED ORDER — PROMETHAZINE-DM 6.25-15 MG/5ML PO SYRP: 5.0000 mL | ORAL_SOLUTION | Freq: Every evening | ORAL | 0 refills | Status: DC | PRN

## 2022-02-28 MED ORDER — ALBUTEROL SULFATE HFA 108 (90 BASE) MCG/ACT IN AERS
INHALATION_SPRAY | RESPIRATORY_TRACT | Status: AC
  Filled 2022-02-28: qty 6.7

## 2022-02-28 NOTE — Discharge Instructions (Signed)
You have a viral upper respiratory infection.  COVID-19 testing is pending. We will call you with results if positive. If your COVID test is positive, you must stay at home until day 6 of symptoms. On day 6, you may go out into public and go back to work, but you must wear a mask until day 11 of symptoms to prevent spread to others.  Purchase mucinex (guaifenesin) 1200mg  and take this every 12 hours for the next few days to thin your nasal congestion and mucous so that you are able to get out of your body easier by coughing and blowing your nose. Drink plenty of water while taking this.  Take Promethazine DM cough medication to help with your cough at nighttime so that you are able to sleep. Do not drive, drink alcohol, or go to work while taking this medication since it can make you sleepy. Only take this at nighttime.   Take tessalon pearles every 8 hours as needed for cough.  Albuterol 1 to 2 puffs every 4-6 hours as needed for cough, shortness of breath, and wheezing.  Come back to urgent care if you experience chest tightness, shortness of breath, or worsening wheezing that is not relieved by albuterol inhaler.  You may take tylenol 1,000mg  and ibuprofen 600mg  every 6 hours with food as needed for fever/chills, sore throat, aches/pains, and inflammation associated with viral illness. Take this with food to avoid stomach upset.    If you develop any new or worsening symptoms, please return.  If your symptoms are severe, please go to the emergency room.  Follow-up with your primary care provider for further evaluation and management of your symptoms as well as ongoing wellness visits.  I hope you feel better!
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
## 2022-02-28 NOTE — ED Triage Notes (Signed)
C/O tactile fever, body aches, productive cough, chills, sore throat. Took Nyquil last night.

## 2022-03-01 LAB — SARS CORONAVIRUS 2 (TAT 6-24 HRS): SARS Coronavirus 2: NEGATIVE

## 2022-06-27 ENCOUNTER — Encounter (HOSPITAL_COMMUNITY): Payer: Self-pay

## 2022-06-27 ENCOUNTER — Ambulatory Visit (HOSPITAL_COMMUNITY)
Admission: EM | Admit: 2022-06-27 | Discharge: 2022-06-27 | Disposition: A | Discharge status: Home / Self Care | Payer: Self-pay | Attending: Emergency Medicine | Admitting: Emergency Medicine

## 2022-06-27 DIAGNOSIS — Z113 Encounter for screening for infections with a predominantly sexual mode of transmission: Secondary | ICD-10-CM | POA: Insufficient documentation

## 2022-06-27 LAB — HIV ANTIBODY (ROUTINE TESTING W REFLEX): HIV Screen 4th Generation wRfx: NONREACTIVE

## 2022-06-27 NOTE — ED Triage Notes (Signed)
Patient here today to have STD testing. No symptoms. Patient states that he hasn't been tested since last year.
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
## 2022-06-27 NOTE — Discharge Instructions (Signed)
We will call you if anything on your swab or blood work returns positive. Please abstain from sexual intercourse until your results return.  

## 2022-06-28 LAB — CYTOLOGY, (ORAL, ANAL, URETHRAL) ANCILLARY ONLY
Chlamydia: NEGATIVE
Comment: NEGATIVE
Comment: NEGATIVE
Comment: NORMAL
Neisseria Gonorrhea: NEGATIVE
Trichomonas: NEGATIVE

## 2022-06-28 LAB — RPR: RPR Ser Ql: NONREACTIVE

## 2022-09-19 ENCOUNTER — Encounter (HOSPITAL_COMMUNITY): Payer: Self-pay | Admitting: *Deleted

## 2022-09-19 ENCOUNTER — Ambulatory Visit (INDEPENDENT_AMBULATORY_CARE_PROVIDER_SITE_OTHER): Payer: Self-pay

## 2022-09-19 ENCOUNTER — Ambulatory Visit (HOSPITAL_COMMUNITY)
Admission: EM | Admit: 2022-09-19 | Discharge: 2022-09-19 | Disposition: A | Discharge status: Home / Self Care | Payer: Self-pay | Attending: Emergency Medicine | Admitting: Emergency Medicine

## 2022-09-19 DIAGNOSIS — R030 Elevated blood-pressure reading, without diagnosis of hypertension: Secondary | ICD-10-CM

## 2022-09-19 DIAGNOSIS — Z113 Encounter for screening for infections with a predominantly sexual mode of transmission: Secondary | ICD-10-CM

## 2022-09-19 DIAGNOSIS — M25531 Pain in right wrist: Secondary | ICD-10-CM
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
## 2022-09-19 NOTE — ED Triage Notes (Signed)
Pt states yesterday he punched a door since he is having right wrist pain. He is wearing a wrap from the pharmacy without relief. He states he can't lift anything so he needs a note for work. He has taken IBU.   He also would like STI check denies any known exposures or sx.

## 2022-09-19 NOTE — Discharge Instructions (Addendum)
Please follow up with PCP recheck bp as it was elevated today, orthopedist regarding recheck of right wrist,-call for appt; your xray was negative for acute findings, wear your velcro brace for support(do not apply too tight) Avoid hitting walls,doors,etc. as you can cause fractures/lacerations, or further injuries). May take over the counter tylenol for pain as label directed. Ice 20 min 3 x daily to wrist to help with discomfort/pain. Check my chart for STI results,condoms with all sexual activity/safe sex practice.

## 2022-09-20 LAB — CYTOLOGY, (ORAL, ANAL, URETHRAL) ANCILLARY ONLY
Chlamydia: NEGATIVE
Comment: NEGATIVE
Comment: NEGATIVE
Comment: NORMAL
Neisseria Gonorrhea: NEGATIVE
Trichomonas: NEGATIVE

## 2022-10-30 ENCOUNTER — Encounter (HOSPITAL_COMMUNITY): Payer: Self-pay

## 2022-10-30 ENCOUNTER — Ambulatory Visit (HOSPITAL_COMMUNITY)
Admission: EM | Admit: 2022-10-30 | Discharge: 2022-10-30 | Disposition: A | Discharge status: Home / Self Care | Payer: Self-pay | Attending: Internal Medicine | Admitting: Internal Medicine

## 2022-10-30 DIAGNOSIS — Z711 Person with feared health complaint in whom no diagnosis is made: Secondary | ICD-10-CM

## 2022-10-30 DIAGNOSIS — K047 Periapical abscess without sinus: Secondary | ICD-10-CM

## 2022-10-30 MED ORDER — AZITHROMYCIN 250 MG PO TABS: ORAL_TABLET | ORAL | 0 refills | Status: DC

## 2022-10-30 MED ORDER — ALBUTEROL SULFATE HFA 108 (90 BASE) MCG/ACT IN AERS: 1.0000 | INHALATION_SPRAY | RESPIRATORY_TRACT | 1 refills | Status: DC | PRN

## 2022-10-30 NOTE — ED Triage Notes (Addendum)
Patient c/o fever and body aches since yesterday. Patient states he has had a fever as high as 103.0  Patient states he has been taking Excedrin and the last dose was at 0800 today.   Paatient is also wanting an STD check. Patient denies symptoms,but states, "I just want to make sure I am good."
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
## 2022-10-30 NOTE — Discharge Instructions (Addendum)
Start antibiotics 2 tablets on the first day, then daily until finished. Keep follow up appointment with dentist on Monday. Return to clinic if symptoms worsen or fail to improve.

## 2022-11-01 ENCOUNTER — Ambulatory Visit (HOSPITAL_COMMUNITY)
Admission: EM | Admit: 2022-11-01 | Discharge: 2022-11-01 | Disposition: A | Discharge status: Home / Self Care | Payer: Self-pay | Attending: Internal Medicine | Admitting: Internal Medicine

## 2022-11-01 ENCOUNTER — Telehealth (HOSPITAL_COMMUNITY): Payer: Self-pay

## 2022-11-01 DIAGNOSIS — Z113 Encounter for screening for infections with a predominantly sexual mode of transmission: Secondary | ICD-10-CM | POA: Insufficient documentation

## 2022-11-01 NOTE — ED Triage Notes (Signed)
Pt here for reswab for cytology.

## 2022-11-02 ENCOUNTER — Telehealth: Payer: Self-pay | Admitting: Physician Assistant

## 2022-11-02 DIAGNOSIS — Z113 Encounter for screening for infections with a predominantly sexual mode of transmission: Secondary | ICD-10-CM

## 2022-11-02 NOTE — Patient Instructions (Signed)
Adrian Mcgrath, thank you for joining [name], PA-C for today's virtual visit.  While this provider is not your primary care provider (PCP), if your PCP is located in our provider database this encounter information will be shared with them immediately following your visit.   A [facility] MyChart account gives you access to today's visit and all your visits, tests, and labs performed at [facility] " click here if you don't have a [facility] MyChart account or go to [email or web address]  Consent: (Patient) Adrian Mcgrath provided verbal consent for this virtual visit at the beginning of the encounter.  Current Medications:  Current Outpatient Medications:    albuterol (VENTOLIN HFA) 108 (90 Base) MCG/ACT inhaler, Inhale 1-2 puffs into the lungs every 4 (four) hours as needed for wheezing or shortness of breath., Disp: 8 g, Rfl: 1   azithromycin (ZITHROMAX Z-PAK) 250 MG tablet, Take 2 tablets on the first day and then 1 daily until finished., Disp: 6 tablet, Rfl: 0   Medications ordered in this encounter:  No orders of the defined types were placed in this encounter.    *If you need refills on other medications prior to your next appointment, please contact your pharmacy*  Follow-Up: Call back or seek an in-person evaluation if the symptoms worsen or if the condition fails to improve as anticipated.  [facility] [phone]  Other Instructions The Urgent care you were seen at may still call you with your results as any further follow-up recommendations as discussed.   Safe Sex Practicing safe sex means taking steps before and during sex to reduce your risk of: Getting an STI (sexually transmitted infection). Giving your partner an STI. Unwanted or unplanned pregnancy. How to practice safe sex Ways you can practice safe sex  Limit your sexual partners to only one partner who is having sex with only you. Avoid using  alcohol and drugs before having sex. Alcohol and drugs can affect your judgment. Before having sex with a new partner: Talk to your partner about past partners, past STIs, and drug use. Get screened for STIs and discuss the results with your partner. Ask your partner to get screened too. Check your body regularly for sores, blisters, rashes, or unusual discharge. If you notice any of these problems, visit your health care provider. Avoid sexual contact if you have symptoms of an infection or you are being treated for an STI. While having sex, use a condom. Make sure to: Use a condom every time you have vaginal, oral, or anal sex. Both females and males should wear condoms during oral sex. Keep condoms in place from the beginning to the end of sexual activity. Use a latex condom, if possible. Latex condoms offer the best protection. Use only water-based lubricants with a condom. Using petroleum-based lubricants or oils will weaken the condom and increase the chance that it will break. Ways your health care provider can help you practice safe sex  See your health care provider for regular screenings, exams, and tests for STIs. Talk with your health care provider about what kind of birth control (contraception) is best for you. Get vaccinated against hepatitis B and human papillomavirus (HPV). If you are at risk of being infected with HIV (human immunodeficiency virus), talk with your health care provider about taking a prescription medicine to prevent HIV infection. You are at risk for HIV if you: Are a man who has sex with other men. Are sexually active with more  than one partner. Take drugs by injection. Have a sex partner who has HIV. Have unprotected sex. Have sex with someone who has sex with both men and women. Have had an STI. Follow these instructions at home: Take over-the-counter and prescription medicines only as told by your health care provider. Keep all follow-up visits. This is  important. Where to find more information Centers for Disease Control and Prevention: FootballExhibition.com.br Planned Parenthood: www.plannedparenthood.org Office on [employer] Health: http://hoffman.com/ Summary Practicing safe sex means taking steps before and during sex to reduce your risk getting an STI, giving your partner an STI, and having an unwanted or unplanned pregnancy. Before having sex with a new partner, talk to your partner about past partners, past STIs, and drug use. Use a condom every time you have vaginal, oral, or anal sex. Both females and males should wear condoms during oral sex. Check your body regularly for sores, blisters, rashes, or unusual discharge. If you notice any of these problems, visit your health care provider. See your health care provider for regular screenings, exams, and tests for STIs. This information is not intended to replace advice given to you by your health care provider. Make sure you discuss any questions you have with your health care provider. Document Revised: 07/30/2019 Document Reviewed: 07/30/2019 Elsevier Patient Education  2024 Elsevier Inc.    If you have been instructed to have an in-person evaluation today at a local Urgent Care facility, please use the link below. It will take you to a list of all of our available [facility], including address, phone number and hours of operation. Please do not delay care.  [facility]  If you or a family member do not have a primary care provider, use the link below to schedule a visit and establish care. When you choose a [facility] primary care physician or advanced practice provider, you gain a long-term partner in health. Find a Primary Care Provider  Learn more about [facility]'s in-office and virtual care options:  - Get Care Now
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
## 2022-11-03 LAB — CYTOLOGY, (ORAL, ANAL, URETHRAL) ANCILLARY ONLY
Chlamydia: NEGATIVE
Comment: NEGATIVE
Comment: NEGATIVE
Comment: NORMAL
Neisseria Gonorrhea: NEGATIVE
Trichomonas: NEGATIVE

## 2022-11-10 ENCOUNTER — Encounter: Payer: Self-pay | Admitting: Family Medicine

## 2022-11-10 ENCOUNTER — Telehealth: Payer: Self-pay | Admitting: Family Medicine

## 2022-11-10 DIAGNOSIS — R519 Headache, unspecified: Secondary | ICD-10-CM
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
## 2022-12-31 ENCOUNTER — Encounter (HOSPITAL_COMMUNITY): Payer: Self-pay

## 2022-12-31 ENCOUNTER — Other Ambulatory Visit: Payer: Self-pay

## 2022-12-31 ENCOUNTER — Emergency Department (HOSPITAL_COMMUNITY)
Admission: EM | Admit: 2022-12-31 | Discharge: 2022-12-31 | Disposition: A | Discharge status: Home / Self Care | Payer: Self-pay | Attending: Emergency Medicine | Admitting: Emergency Medicine

## 2022-12-31 DIAGNOSIS — L989 Disorder of the skin and subcutaneous tissue, unspecified: Secondary | ICD-10-CM | POA: Insufficient documentation

## 2022-12-31 NOTE — Discharge Instructions (Signed)
You can put a warm compress on the cyst to help it heal

## 2022-12-31 NOTE — ED Triage Notes (Signed)
Patient reports left temporal skin cyst filled with white drainage this week .
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
## 2023-03-05 ENCOUNTER — Emergency Department (HOSPITAL_COMMUNITY): Admission: EM | Admit: 2023-03-05 | Discharge: 2023-03-05 | Discharge status: Against Medical Advice | Payer: Self-pay

## 2023-03-05 ENCOUNTER — Other Ambulatory Visit: Payer: Self-pay

## 2023-03-05 NOTE — ED Notes (Signed)
Pt decided to leave before being triaged.

## 2023-03-07 ENCOUNTER — Encounter (HOSPITAL_COMMUNITY): Payer: Self-pay

## 2023-03-07 ENCOUNTER — Ambulatory Visit (HOSPITAL_COMMUNITY)
Admission: EM | Admit: 2023-03-07 | Discharge: 2023-03-07 | Disposition: A | Discharge status: Home / Self Care | Payer: Self-pay | Attending: Internal Medicine | Admitting: Internal Medicine

## 2023-03-07 DIAGNOSIS — L0201 Cutaneous abscess of face: Secondary | ICD-10-CM | POA: Insufficient documentation

## 2023-03-07 DIAGNOSIS — Z113 Encounter for screening for infections with a predominantly sexual mode of transmission: Secondary | ICD-10-CM

## 2023-03-07 LAB — CYTOLOGY, (ORAL, ANAL, URETHRAL) ANCILLARY ONLY
Chlamydia: NEGATIVE
Comment: NEGATIVE
Comment: NEGATIVE
Comment: NORMAL
Neisseria Gonorrhea: NEGATIVE
Trichomonas: NEGATIVE

## 2023-03-07 MED ORDER — DOXYCYCLINE HYCLATE 100 MG PO CAPS: 100.0000 mg | ORAL_CAPSULE | Freq: Two times a day (BID) | ORAL | 0 refills | Status: AC

## 2023-03-07 NOTE — Discharge Instructions (Signed)
Please avoid using peroxide to clean your wound Please take antibiotics as prescribed Please apply antibiotic ointment and do daily wound dressing changes You may take Tylenol or ibuprofen as needed for pain Please feel free to return to urgent care if you have worsening symptoms or any other concerns.
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
## 2023-03-31 ENCOUNTER — Ambulatory Visit (HOSPITAL_COMMUNITY): Payer: Self-pay

## 2023-03-31 ENCOUNTER — Encounter (HOSPITAL_COMMUNITY): Payer: Self-pay

## 2023-03-31 ENCOUNTER — Ambulatory Visit (HOSPITAL_COMMUNITY)
Admission: EM | Admit: 2023-03-31 | Discharge: 2023-03-31 | Disposition: A | Discharge status: Home / Self Care | Payer: Self-pay | Attending: Emergency Medicine | Admitting: Emergency Medicine

## 2023-03-31 ENCOUNTER — Telehealth (HOSPITAL_COMMUNITY): Payer: Self-pay

## 2023-03-31 ENCOUNTER — Ambulatory Visit: Payer: Self-pay

## 2023-03-31 DIAGNOSIS — Z113 Encounter for screening for infections with a predominantly sexual mode of transmission: Secondary | ICD-10-CM | POA: Insufficient documentation

## 2023-03-31 DIAGNOSIS — S0501XA Injury of conjunctiva and corneal abrasion without foreign body, right eye, initial encounter: Secondary | ICD-10-CM | POA: Insufficient documentation

## 2023-03-31 MED ORDER — ERYTHROMYCIN 5 MG/GM OP OINT: TOPICAL_OINTMENT | OPHTHALMIC | 0 refills | Status: DC

## 2023-03-31 MED ORDER — TETRACAINE HCL 0.5 % OP SOLN
OPHTHALMIC | Status: AC
  Filled 2023-03-31: qty 4

## 2023-03-31 MED ORDER — FLUORESCEIN SODIUM 1 MG OP STRP
ORAL_STRIP | OPHTHALMIC | Status: AC
  Filled 2023-03-31: qty 1
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
## 2023-03-31 NOTE — Telephone Encounter (Signed)
Discharged pt prior to lab work being completed.  Attempted to reach patient x2. LVM.  If pt returns, can do Nurse Visit for lab collection.

## 2023-03-31 NOTE — ED Notes (Signed)
Discharged pt prior to blood being drawn. Checked lobby; pt had already left. Attempted to reach patient x2. LVM.
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
## 2023-03-31 NOTE — Discharge Instructions (Addendum)
Start using erythromycin ointment to right eye 4 times daily for 5 days.  Your test results will come back over the next few days and someone will call if results are positive and require treatment.  Return here as needed.

## 2023-04-01 LAB — CYTOLOGY, (ORAL, ANAL, URETHRAL) ANCILLARY ONLY
Chlamydia: NEGATIVE
Comment: NEGATIVE
Comment: NEGATIVE
Comment: NORMAL
Neisseria Gonorrhea: NEGATIVE
Trichomonas: NEGATIVE

## 2023-05-21 ENCOUNTER — Telehealth: Payer: Self-pay | Admitting: Nurse Practitioner

## 2023-06-30 ENCOUNTER — Encounter (HOSPITAL_COMMUNITY): Payer: Self-pay | Admitting: Emergency Medicine

## 2023-06-30 ENCOUNTER — Other Ambulatory Visit: Payer: Self-pay

## 2023-06-30 ENCOUNTER — Ambulatory Visit (HOSPITAL_COMMUNITY)
Admission: EM | Admit: 2023-06-30 | Discharge: 2023-07-01 | Disposition: A | Discharge status: Home / Self Care | Payer: Self-pay | Attending: Emergency Medicine | Admitting: Emergency Medicine

## 2023-06-30 DIAGNOSIS — F17210 Nicotine dependence, cigarettes, uncomplicated: Secondary | ICD-10-CM | POA: Insufficient documentation

## 2023-06-30 DIAGNOSIS — Z23 Encounter for immunization: Secondary | ICD-10-CM | POA: Insufficient documentation

## 2023-06-30 DIAGNOSIS — S68626A Partial traumatic transphalangeal amputation of right little finger, initial encounter: Secondary | ICD-10-CM

## 2023-06-30 DIAGNOSIS — Y9389 Activity, other specified: Secondary | ICD-10-CM | POA: Insufficient documentation

## 2023-06-30 DIAGNOSIS — Z7951 Long term (current) use of inhaled steroids: Secondary | ICD-10-CM | POA: Insufficient documentation

## 2023-06-30 DIAGNOSIS — J439 Emphysema, unspecified: Secondary | ICD-10-CM | POA: Insufficient documentation

## 2023-06-30 DIAGNOSIS — S68126A Partial traumatic metacarpophalangeal amputation of right little finger, initial encounter: Secondary | ICD-10-CM | POA: Insufficient documentation

## 2023-06-30 DIAGNOSIS — J45909 Unspecified asthma, uncomplicated: Secondary | ICD-10-CM | POA: Insufficient documentation

## 2023-06-30 DIAGNOSIS — W260XXA Contact with knife, initial encounter: Secondary | ICD-10-CM | POA: Insufficient documentation

## 2023-06-30 MED ORDER — TETANUS-DIPHTH-ACELL PERTUSSIS 5-2.5-18.5 LF-MCG/0.5 IM SUSY
0.5000 mL | PREFILLED_SYRINGE | Freq: Once | INTRAMUSCULAR | Status: AC
  Administered 2023-07-01: 0.5 mL via INTRAMUSCULAR
  Filled 2023-06-30: qty 0.5

## 2023-06-30 MED ORDER — FENTANYL CITRATE PF 50 MCG/ML IJ SOSY
100.0000 ug | PREFILLED_SYRINGE | Freq: Once | INTRAMUSCULAR | Status: AC
  Administered 2023-07-01: 100 ug via INTRAVENOUS
  Filled 2023-06-30: qty 2

## 2023-06-30 MED ORDER — LIDOCAINE HCL (PF) 1 % IJ SOLN
30.0000 mL | Freq: Once | INTRAMUSCULAR | Status: AC
  Administered 2023-07-01: 30 mL

## 2023-06-30 MED ORDER — LIDOCAINE HCL 2 % IJ SOLN
INTRAMUSCULAR | Status: AC
  Filled 2023-06-30: qty 20

## 2023-06-30 MED ORDER — SODIUM CHLORIDE 0.9 % IV BOLUS
1000.0000 mL | Freq: Once | INTRAVENOUS | Status: AC
  Administered 2023-07-01: 1000 mL via INTRAVENOUS

## 2023-06-30 NOTE — ED Triage Notes (Addendum)
 Patient BIB [facility] c/o right pinky finger laceration after cutting his finger on pocket knife while working on tire.  EMS reports bleeding is controlled. Patient's finger is bent sideways and open at knuckle.   160/80 HR 120 SPO2 98%

## 2023-07-01 ENCOUNTER — Other Ambulatory Visit: Payer: Self-pay

## 2023-07-01 ENCOUNTER — Emergency Department (HOSPITAL_BASED_OUTPATIENT_CLINIC_OR_DEPARTMENT_OTHER): Payer: Self-pay | Admitting: Critical Care Medicine

## 2023-07-01 ENCOUNTER — Emergency Department (HOSPITAL_COMMUNITY): Payer: Self-pay

## 2023-07-01 ENCOUNTER — Encounter: Payer: Self-pay | Admitting: Orthopedic Surgery

## 2023-07-01 ENCOUNTER — Other Ambulatory Visit: Payer: Self-pay | Admitting: Orthopedic Surgery

## 2023-07-01 ENCOUNTER — Encounter (HOSPITAL_COMMUNITY): Payer: Self-pay | Admitting: Critical Care Medicine

## 2023-07-01 ENCOUNTER — Emergency Department (HOSPITAL_COMMUNITY): Payer: Self-pay | Admitting: Critical Care Medicine

## 2023-07-01 ENCOUNTER — Ambulatory Visit (HOSPITAL_COMMUNITY): Payer: Self-pay

## 2023-07-01 ENCOUNTER — Encounter (HOSPITAL_COMMUNITY)
Admission: EM | Disposition: A | Discharge status: Home / Self Care | Payer: Self-pay | Source: Home / Self Care | Attending: Emergency Medicine

## 2023-07-01 DIAGNOSIS — S68126A Partial traumatic metacarpophalangeal amputation of right little finger, initial encounter: Secondary | ICD-10-CM

## 2023-07-01 HISTORY — PX: AMPUTATION FINGER: SHX6594

## 2023-07-01 LAB — CBC
HCT: 41.9 % (ref 39.0–52.0)
Hemoglobin: 13.2 g/dL (ref 13.0–17.0)
MCH: 21.3 pg — ABNORMAL LOW (ref 26.0–34.0)
MCHC: 31.5 g/dL (ref 30.0–36.0)
MCV: 67.5 fL — ABNORMAL LOW (ref 80.0–100.0)
Platelets: 251 10*3/uL (ref 150–400)
RBC: 6.21 MIL/uL — ABNORMAL HIGH (ref 4.22–5.81)
RDW: 17.4 % — ABNORMAL HIGH (ref 11.5–15.5)
WBC: 10.1 10*3/uL (ref 4.0–10.5)
nRBC: 0 % (ref 0.0–0.2)

## 2023-07-01 LAB — BASIC METABOLIC PANEL WITH GFR
Anion gap: 13 (ref 5–15)
BUN: 24 mg/dL — ABNORMAL HIGH (ref 6–20)
CO2: 20 mmol/L — ABNORMAL LOW (ref 22–32)
Calcium: 9.1 mg/dL (ref 8.9–10.3)
Chloride: 104 mmol/L (ref 98–111)
Creatinine, Ser: 1.13 mg/dL (ref 0.61–1.24)
GFR, Estimated: >60 mL/min (ref >60)
Glucose, Bld: 103 mg/dL — ABNORMAL HIGH (ref 70–99)
Potassium: 3.8 mmol/L (ref 3.5–5.1)
Sodium: 137 mmol/L (ref 135–145)

## 2023-07-01 LAB — TYPE AND SCREEN
ABO/RH(D): B POS
Antibody Screen: NEGATIVE

## 2023-07-01 SURGERY — AMPUTATION, FINGER
Anesthesia: General | Site: Little Finger | Laterality: Right

## 2023-07-01 MED ORDER — OXYCODONE HCL 5 MG PO TABS: 5.0000 mg | ORAL_TABLET | Freq: Once | ORAL | Status: DC | PRN

## 2023-07-01 MED ORDER — PHENYLEPHRINE 80 MCG/ML (10ML) SYRINGE FOR IV PUSH (FOR BLOOD PRESSURE SUPPORT)
PREFILLED_SYRINGE | INTRAVENOUS | Status: DC | PRN
Start: 2023-07-01 — End: 2023-07-01
  Administered 2023-07-01 (×4): 80 ug via INTRAVENOUS

## 2023-07-01 MED ORDER — FENTANYL CITRATE (PF) 100 MCG/2ML IJ SOLN: 25.0000 ug | INTRAMUSCULAR | Status: DC | PRN

## 2023-07-01 MED ORDER — DEXMEDETOMIDINE HCL IN NACL 80 MCG/20ML IV SOLN
INTRAVENOUS | Status: DC | PRN
Start: 2023-07-01 — End: 2023-07-01
  Administered 2023-07-01: 4 ug via INTRAVENOUS
  Administered 2023-07-01 (×2): 8 ug via INTRAVENOUS

## 2023-07-01 MED ORDER — CLINDAMYCIN PHOSPHATE 900 MG/50ML IV SOLN
900.0000 mg | INTRAVENOUS | Status: AC
  Administered 2023-07-01: 900 mg via INTRAVENOUS
  Filled 2023-07-01: qty 50

## 2023-07-01 MED ORDER — DROPERIDOL 2.5 MG/ML IJ SOLN: 0.6250 mg | Freq: Once | INTRAMUSCULAR | Status: DC | PRN

## 2023-07-01 MED ORDER — LACTATED RINGERS IV SOLN
INTRAVENOUS | Status: DC | PRN
Start: 2023-07-01 — End: 2023-07-01

## 2023-07-01 MED ORDER — ACETAMINOPHEN 10 MG/ML IV SOLN
INTRAVENOUS | Status: DC | PRN
  Administered 2023-07-01: 1000 mg via INTRAVENOUS

## 2023-07-01 MED ORDER — MIDAZOLAM HCL 2 MG/2ML IJ SOLN
INTRAMUSCULAR | Status: AC
  Filled 2023-07-01: qty 2

## 2023-07-01 MED ORDER — FENTANYL CITRATE (PF) 250 MCG/5ML IJ SOLN
INTRAMUSCULAR | Status: DC | PRN
  Administered 2023-07-01 (×2): 25 ug via INTRAVENOUS
  Administered 2023-07-01: 100 ug via INTRAVENOUS
  Administered 2023-07-01: 50 ug via INTRAVENOUS

## 2023-07-01 MED ORDER — BUPIVACAINE HCL (PF) 0.25 % IJ SOLN
INTRAMUSCULAR | Status: DC | PRN
  Administered 2023-07-01: 7 mL

## 2023-07-01 MED ORDER — FENTANYL CITRATE (PF) 250 MCG/5ML IJ SOLN
INTRAMUSCULAR | Status: AC
Start: 2023-07-01 — End: ?
  Filled 2023-07-01: qty 5

## 2023-07-01 MED ORDER — SODIUM CHLORIDE 0.9 % IV BOLUS
1000.0000 mL | Freq: Once | INTRAVENOUS | Status: AC
  Administered 2023-07-01: 1000 mL via INTRAVENOUS

## 2023-07-01 MED ORDER — HYDROMORPHONE HCL 1 MG/ML IJ SOLN: 0.2500 mg | INTRAMUSCULAR | Status: DC | PRN

## 2023-07-01 MED ORDER — SUCCINYLCHOLINE CHLORIDE 200 MG/10ML IV SOSY
PREFILLED_SYRINGE | INTRAVENOUS | Status: DC | PRN
  Administered 2023-07-01: 80 mg via INTRAVENOUS

## 2023-07-01 MED ORDER — OXYCODONE HCL 5 MG PO TABS: 5.0000 mg | ORAL_TABLET | Freq: Four times a day (QID) | ORAL | 0 refills | Status: AC | PRN

## 2023-07-01 MED ORDER — PROPOFOL 10 MG/ML IV BOLUS
INTRAVENOUS | Status: DC | PRN
  Administered 2023-07-01: 200 mg via INTRAVENOUS
  Administered 2023-07-01: 50 mg via INTRAVENOUS

## 2023-07-01 MED ORDER — ALBUTEROL SULFATE (2.5 MG/3ML) 0.083% IN NEBU
INHALATION_SOLUTION | RESPIRATORY_TRACT | Status: DC
Start: 2023-07-01 — End: 2023-07-01
  Filled 2023-07-01: qty 3

## 2023-07-01 MED ORDER — OXYCODONE HCL 5 MG/5ML PO SOLN: 5.0000 mg | Freq: Once | ORAL | Status: DC | PRN

## 2023-07-01 MED ORDER — HYDROMORPHONE HCL 1 MG/ML IJ SOLN
0.5000 mg | INTRAMUSCULAR | Status: DC | PRN
  Administered 2023-07-01: 0.5 mg via INTRAVENOUS
  Filled 2023-07-01: qty 1

## 2023-07-01 MED ORDER — ACETAMINOPHEN 10 MG/ML IV SOLN
INTRAVENOUS | Status: AC
  Filled 2023-07-01: qty 100

## 2023-07-01 MED ORDER — ONDANSETRON HCL 4 MG/2ML IJ SOLN
4.0000 mg | Freq: Once | INTRAMUSCULAR | Status: AC
  Filled 2023-07-01: qty 2

## 2023-07-01 MED ORDER — ALBUTEROL SULFATE (2.5 MG/3ML) 0.083% IN NEBU
2.5000 mg | INHALATION_SOLUTION | Freq: Four times a day (QID) | RESPIRATORY_TRACT | Status: DC | PRN
  Administered 2023-07-01: 2.5 mg via RESPIRATORY_TRACT

## 2023-07-01 MED ORDER — LIDOCAINE 2% (20 MG/ML) 5 ML SYRINGE
INTRAMUSCULAR | Status: DC | PRN
  Administered 2023-07-01: 100 mg via INTRAVENOUS

## 2023-07-01 MED ORDER — HYDROMORPHONE HCL 1 MG/ML IJ SOLN
INTRAMUSCULAR | Status: AC
  Filled 2023-07-01: qty 1

## 2023-07-01 MED ORDER — PROPOFOL 10 MG/ML IV BOLUS
INTRAVENOUS | Status: AC
  Filled 2023-07-01: qty 20

## 2023-07-01 MED ORDER — KETOROLAC TROMETHAMINE 30 MG/ML IJ SOLN
INTRAMUSCULAR | Status: DC | PRN
  Administered 2023-07-01: 30 mg via INTRAVENOUS

## 2023-07-01 MED ORDER — SODIUM CHLORIDE 0.9 % IR SOLN
Status: DC | PRN
  Administered 2023-07-01: 1

## 2023-07-01 MED ORDER — MIDAZOLAM HCL 5 MG/5ML IJ SOLN
INTRAMUSCULAR | Status: DC | PRN
  Administered 2023-07-01: 2 mg via INTRAVENOUS

## 2023-07-01 MED ORDER — ONDANSETRON HCL 4 MG/2ML IJ SOLN
INTRAMUSCULAR | Status: DC | PRN
  Administered 2023-07-01: 4 mg via INTRAVENOUS

## 2023-07-01 MED ORDER — ONDANSETRON HCL 4 MG/2ML IJ SOLN
INTRAMUSCULAR | Status: AC
Start: 2023-07-01 — End: 2023-07-01
  Administered 2023-07-01: 4 mg via INTRAVENOUS
  Filled 2023-07-01: qty 2

## 2023-07-01 MED ORDER — DEXAMETHASONE SODIUM PHOSPHATE 10 MG/ML IJ SOLN
INTRAMUSCULAR | Status: DC | PRN
  Administered 2023-07-01: 4 mg via INTRAVENOUS

## 2023-07-01 SURGICAL SUPPLY — 32 items
BAG COUNTER SPONGE SURGICOUNT (BAG) ×1 IMPLANT
BNDG ESMARK 4X9 LF (GAUZE/BANDAGES/DRESSINGS) ×1 IMPLANT
CORD BIPOLAR FORCEPS 12FT (ELECTRODE) ×1 IMPLANT
COVER SURGICAL LIGHT HANDLE (MISCELLANEOUS) ×1 IMPLANT
CUFF TOURN SGL QUICK 18X4 (TOURNIQUET CUFF) ×1 IMPLANT
DRSG EMULSION OIL 3X3 NADH (GAUZE/BANDAGES/DRESSINGS) IMPLANT
GAUZE SPONGE 4X4 12PLY STRL (GAUZE/BANDAGES/DRESSINGS) ×1 IMPLANT
GAUZE STRETCH 2X75IN STRL (MISCELLANEOUS) IMPLANT
GLOVE BIO SURGEON STRL SZ7.5 (GLOVE) ×1 IMPLANT
GLOVE BIOGEL PI IND STRL 7.5 (GLOVE) ×1 IMPLANT
GLOVE INDICATOR 7.0 STRL GRN (GLOVE) IMPLANT
GLOVE SURG SS PI 7.0 STRL IVOR (GLOVE) IMPLANT
GOWN STRL REUS W/ TWL LRG LVL3 (GOWN DISPOSABLE) ×3 IMPLANT
GOWN STRL REUS W/ TWL XL LVL3 (GOWN DISPOSABLE) ×1 IMPLANT
KIT BASIN OR (CUSTOM PROCEDURE TRAY) ×1 IMPLANT
KIT TURNOVER KIT B (KITS) ×1 IMPLANT
KWIRE .045X4 (WIRE) IMPLANT
NDL HYPO 25GX1X1/2 BEV (NEEDLE) IMPLANT
NEEDLE HYPO 25GX1X1/2 BEV (NEEDLE) ×1 IMPLANT
NS IRRIG 1000ML POUR BTL (IV SOLUTION) ×1 IMPLANT
PACK ORTHO EXTREMITY (CUSTOM PROCEDURE TRAY) ×1 IMPLANT
SOAP 2 % CHG 4 OZ (WOUND CARE) ×1 IMPLANT
SPONGE T-LAP 18X18 ~~LOC~~+RFID (SPONGE) ×1 IMPLANT
SUT ETHILON 8 0 BV130 4 (SUTURE) IMPLANT
SUT SUPRAMID 3-0 (SUTURE) IMPLANT
SUT VIC AB 4-0 PS2 18 (SUTURE) IMPLANT
SUT VICRYL RAPIDE 4/0 PS 2 (SUTURE) IMPLANT
SYR CONTROL 10ML LL (SYRINGE) IMPLANT
TOWEL GREEN STERILE (TOWEL DISPOSABLE) ×1 IMPLANT
TUBE CONNECTING 12X1/4 (SUCTIONS) ×1 IMPLANT
UNDERPAD 30X36 HEAVY ABSORB (UNDERPADS AND DIAPERS) ×1 IMPLANT
YANKAUER SUCT BULB TIP NO VENT (SUCTIONS) ×1 IMPLANT

## 2023-07-01 NOTE — Anesthesia Preprocedure Evaluation (Addendum)
 Anesthesia Evaluation  Patient identified by MRN, date of birth, ID band Patient awake    Reviewed: Allergy & Precautions, NPO status , Patient's Chart, lab work & pertinent test results  Airway Mallampati: II  TM Distance: >3 FB Neck ROM: Full    Dental  (+) Dental Advisory Given, Teeth Intact   Pulmonary asthma , neg recent URI, Current Smoker   Pulmonary exam normal breath sounds clear to auscultation       Cardiovascular negative cardio ROS Normal cardiovascular exam Rhythm:Regular Rate:Normal     Neuro/Psych negative neurological ROS     GI/Hepatic negative GI ROS,,,(+)     substance abuse  alcohol use and marijuana use  Endo/Other  negative endocrine ROS    Renal/GU negative Renal ROS     Musculoskeletal negative musculoskeletal ROS (+)    Abdominal   Peds  Hematology negative hematology ROS (+)   Anesthesia Other Findings   Reproductive/Obstetrics                             Anesthesia Physical Anesthesia Plan  ASA: 2 and emergent  Anesthesia Plan: General   Post-op Pain Management: Ofirmev  IV (intra-op)* and Toradol  IV (intra-op)*   Induction: Intravenous  PONV Risk Score and Plan: 2 and Ondansetron , Dexamethasone  and Treatment may vary due to age or medical condition  Airway Management Planned: Oral ETT  Additional Equipment:   Intra-op Plan:   Post-operative Plan: Extubation in OR  Informed Consent: I have reviewed the patients History and Physical, chart, labs and discussed the procedure including the risks, benefits and alternatives for the proposed anesthesia with the patient or authorized representative who has indicated his/her understanding and acceptance.     Dental advisory given  Plan Discussed with: CRNA  Anesthesia Plan Comments:        Anesthesia Quick Evaluation

## 2023-07-01 NOTE — ED Notes (Signed)
 Tourniquet applied to R arm 0011

## 2023-07-01 NOTE — Discharge Instructions (Signed)
  Orthopaedic Hand Surgery Discharge Instructions  WEIGHT BEARING STATUS: Non weight bearing on operative extremity  DRESSING CARE: Please keep your dressing/splint/cast clean and dry until your follow-up appointment. You may shower by placing a waterproof covering over your dressing/splint/cast. Contact your surgeon if your splint/cast gets wet. It will need to be changed to prevent skin breakdown.  PAIN CONTROL: First line medications for post operative pain control are Tylenol (acetaminophen) and Motrin (ibuprofen) if you are able to take these medications. If you have been prescribed a medication these can be taken as breakthrough pain medications. Please note that some narcotic pain medication has acetaminophen added and you should never consume more than 4,000mg of acetaminophen in 24-hour period. Please note that if you are given Toradol (ketorolac) you should not take similar medications such as ibuprofen or naproxen.  DISCHARGE MEDICATIONS: If you have been prescribed medication it was sent electronically to your pharmacy. No changes have been made to your home medications.  ICE/ELEVATION: Ice and elevate your injured extremity as needed. Avoid direct contact of ice with skin.   BANDAGE FEELS TOO TIGHT: If your bandage feels too tight, first make sure you are elevating your fingers as much as possible. The outer layer of the bandage can be unwrapped and reapplied more loosely. If no improvement, you may carefully cut the inner layer longitudinally until the pressure has resolved and then rewrap the outer layer. If you are not comfortable with these instructions, please call the office and the bandage can be changed for you.   FOLLOW UP: You will be called after surgery with an appointment date and time, however if you have not received a phone call within 3 days, please call during regular office hours at [phone] to schedule a post operative appointment.  Please Seek Medical Attention  if: Call MD for: pain or pressure in chest, jaw, arm, back, neck  Call MD for: temperature greater than 101 F for more than 24 hrs Call MD for: difficulty breathing Call MD for: incision redness, bleeding, drainage  Call MD for: palpitations or feeling that the heart is racing  Call MD for: increased swelling in arm, leg, ankle, or abdomen  Call MD for: lightheadedness, dizziness, fainting Call 911 or go to ER for any medical emergency if you are not able to get in touch with your doctor   J. Reid Danyl Deems, MD Orthopaedic Hand Surgeon [facility] Office number: [phone] [address] [place], [place] [address]  
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
## 2023-07-01 NOTE — Anesthesia Postprocedure Evaluation (Signed)
 Anesthesia Post Note  Patient: Adrian Mcgrath  Procedure(s) Performed: RIGHT SMALL FINGER iRRIGATION AND DEBRIDEMENT, OPEN REDUCTION, TENDON AND NERVE REPAIR (Right: Little Finger)     Patient location during evaluation: PACU Anesthesia Type: General Level of consciousness: sedated and patient cooperative Pain management: pain level controlled Vital Signs Assessment: post-procedure vital signs reviewed and stable Respiratory status: spontaneous breathing Cardiovascular status: stable Anesthetic complications: no Comments: Laryngospasm post extubation. Pt also likely has severe OSA. Denied cough or recent URI. Lung sounds coarse initially in PACU with high O2 requirement. CXR obtained suggestive of negative pressure pulmonary edema. He was observed for an extended time in PACU. Incentive spirometry and ambulation done. Lung sounds and O2 requirement improved over time. SpO2 94% on RA. I instructed him to return if he had any difficulty breathing to return to the hospital ASAP.  I also recommended he be evaluated for OSA.    No notable events documented.  Last Vitals:  Vitals:   07/01/23 0807 07/01/23 0811  BP:    Pulse: 79 86  Resp: 13 12  Temp:    SpO2: 97% 94%    Last Pain:  Vitals:   07/01/23 0630  TempSrc:   PainSc: Asleep                 Gorman Laughter

## 2023-07-01 NOTE — Transfer of Care (Signed)
 Immediate Anesthesia Transfer of Care Note  Patient: Adrian Mcgrath  Procedure(s) Performed: RIGHT SMALL FINGER iRRIGATION AND DEBRIDEMENT, OPEN REDUCTION, TENDON AND NERVE REPAIR (Right: Little Finger)  Patient Location: PACU  Anesthesia Type:General  Level of Consciousness: awake and alert   Airway & Oxygen Therapy: Patient Spontanous Breathing and Patient connected to face mask oxygen  Post-op Assessment: Report given to RN and Post -op Vital signs reviewed and stable  Post vital signs: Reviewed and stable  Last Vitals:  Vitals Value Taken Time  BP 111/74 07/01/23 0630  Temp    Pulse 101 07/01/23 0631  Resp 21 07/01/23 0631  SpO2 89 % 07/01/23 0631  Vitals shown include unfiled device data.  Last Pain:  Vitals:   07/01/23 0229  TempSrc:   PainSc: 5          Complications: No notable events documented.
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
## 2023-07-01 NOTE — Progress Notes (Signed)
 Incentive spirometer x 5 to .  Patient encouraged to deep breathe and cough.

## 2023-07-01 NOTE — Progress Notes (Signed)
 Orthopedic Tech Progress Note Patient Details:  BASSEM BERNASCONI [birth year] [id number]  Ortho Devices Type of Ortho Device: Arm sling Ortho Device/Splint Location: LUE Ortho Device/Splint Interventions: Ordered, Adjustment   Post Interventions Patient Tolerated: Well Instructions Provided: Poper ambulation with device  Dandre Sisler A Stepan Verrette 07/01/2023, 8:37 AM

## 2023-07-01 NOTE — Op Note (Signed)
 OPERATIVE NOTE  DATE OF PROCEDURE: 07/01/2023  SURGEONS:  Primary: Ltanya Rummer, MD  ASSISTANT: Prentiss Brocks, PA-C  Due to the complexity of the surgery an assistant was necessary to aid in retraction, exposure, limb positioning, closure and dressing application. The use of an assistant on this case follows CMS and CPT guidelines, which allows an assistant to be used because of the complexity level of this case.   PREOPERATIVE DIAGNOSIS: Right small finger traumatic amputation  POSTOPERATIVE DIAGNOSIS: Same  NAME OF PROCEDURE:   Right small finger I&D of open PIP joint Right small finger open reduction of PIP joint Right small finger PIP joint ulnar collateral ligament repair Right small finger extensor tendon repair Right small finger ulnar digital nerve repair Right small finger ulnar digital artery ligation Right small finger flexor tenolysis Right small finger adjacent tissue transfer less than 10 cm  ANESTHESIA: General + Local  SKIN PREPARATION: Hibiclens  ESTIMATED BLOOD LOSS: 15cc  IMPLANTS: none  INDICATIONS:  Adrian Mcgrath is a 33 y.o. male who has the above preoperative diagnosis. The patient has decided to proceed with surgical intervention.  Risks, benefits and alternatives of operative management were discussed including, but not limited to, risks of anesthesia complications, infection, pain, persistent symptoms, stiffness, need for future surgery.  The patient understands, agrees and elects to proceed with surgery.    DESCRIPTION OF PROCEDURE: The patient was met in the pre-operative area and their identity was verified.  The operative location and laterality was also verified and marked.  The patient was brought to the OR and was placed supine on the table.  After repeat patient identification with the operative team anesthesia was provided and the patient was prepped and draped in the usual sterile fashion.  A final timeout was performed verifying the correction  patient, procedure, location and laterality.  The tourniquet was utilized intermittently throughout the case to check perfusion of the digit.  It remained pink and warm well-perfused throughout the case.  There was robust blood flow from the radial vessels.  The wound was thoroughly irrigated and the open joint of the PIP joint was identified and explored.  The joint was thoroughly irrigated and lavaged with normal saline.  An open reduction of the joint was performed by reducing the middle phalanx back to the proximal phalanx reducing the PIP joint.  At this time the ulnar collateral ligament was repaired with figure-of-eight sutures in standard fashion.  The extensor mechanism was repaired at the central slip with absorbable sutures and standard horizontal mattress and figure-of-eight fashion.  The flexor tendon was identified however was completely transected in short and was unable to be repaired acutely.  Flexor tenolysis was performed by identifying the flexor tendon both proximally and distally throughout the zone of injury however had significant gapping and will require secondary grafting.  The ulnar digital artery was identified and clamped.  With the tourniquet down the finger was well-perfused thus ligation of the ulnar digital artery was performed to the right small finger.  The ulnar digital nerve was completely transected however had excellent ability to be brought back into approximation without tension.  This was repaired with 8-0 nylon sutures with microsurgical technique.  There is no significant tension on the repair site.  The wound was again thoroughly irrigated and at this time attention was turned to closure.  There was a complex traumatic wound which was closed in layers in routine fashion with absorbable sutures.  The tissue was rotated into place in order for  primary coverage of the wound.  This achieved a tension-free coverage of the defect.  The digit was pink and warm and  well-perfused.  All counts were correct x 2 a sterile bandage and ulnar gutter splint was applied.  The patient tolerated the procedure well.  He was awoken from anesthesia and brought to PACU for recovery in stable condition.   Zeno Hikes, MD

## 2023-07-01 NOTE — Anesthesia Procedure Notes (Addendum)
 Procedure Name: Intubation Date/Time: 07/01/2023 4:32 AM  Performed by: Shauna Del, CRNAPre-anesthesia Checklist: Emergency Drugs available, Patient identified, Timeout performed, Suction available and Patient being monitored Patient Re-evaluated:Patient Re-evaluated prior to induction Oxygen Delivery Method: Circle system utilized Preoxygenation: Pre-oxygenation with 100% oxygen Induction Type: IV induction and Rapid sequence Laryngoscope Size: Mac and 4 Grade View: Grade II Tube type: Oral Tube size: 7.5 mm Number of attempts: 1 Airway Equipment and Method: Stylet Placement Confirmation: ETT inserted through vocal cords under direct vision, positive ETCO2, CO2 detector and breath sounds checked- equal and bilateral Secured at: 24 cm Tube secured with: Tape Dental Injury: Teeth and Oropharynx as per pre-operative assessment

## 2023-07-02 ENCOUNTER — Other Ambulatory Visit: Payer: Self-pay

## 2023-07-02 ENCOUNTER — Inpatient Hospital Stay (HOSPITAL_COMMUNITY)
Admission: EM | Admit: 2023-07-02 | Discharge: 2023-07-03 | DRG: 919 | Discharge status: Against Medical Advice | Payer: Self-pay | Attending: Internal Medicine | Admitting: Internal Medicine

## 2023-07-02 ENCOUNTER — Emergency Department (HOSPITAL_COMMUNITY): Payer: Self-pay

## 2023-07-02 ENCOUNTER — Encounter (HOSPITAL_COMMUNITY): Payer: Self-pay | Admitting: Orthopedic Surgery

## 2023-07-02 DIAGNOSIS — J95830 Postprocedural hemorrhage and hematoma of a respiratory system organ or structure following a respiratory system procedure: Principal | ICD-10-CM | POA: Diagnosis present

## 2023-07-02 DIAGNOSIS — Z716 Tobacco abuse counseling: Secondary | ICD-10-CM

## 2023-07-02 DIAGNOSIS — Z5329 Procedure and treatment not carried out because of patient's decision for other reasons: Secondary | ICD-10-CM | POA: Diagnosis present

## 2023-07-02 DIAGNOSIS — F259 Schizoaffective disorder, unspecified: Secondary | ICD-10-CM | POA: Diagnosis present

## 2023-07-02 DIAGNOSIS — Z881 Allergy status to other antibiotic agents status: Secondary | ICD-10-CM

## 2023-07-02 DIAGNOSIS — J45909 Unspecified asthma, uncomplicated: Secondary | ICD-10-CM | POA: Diagnosis present

## 2023-07-02 DIAGNOSIS — Y838 Other surgical procedures as the cause of abnormal reaction of the patient, or of later complication, without mention of misadventure at the time of the procedure: Secondary | ICD-10-CM | POA: Diagnosis present

## 2023-07-02 DIAGNOSIS — J9601 Acute respiratory failure with hypoxia: Principal | ICD-10-CM | POA: Diagnosis present

## 2023-07-02 DIAGNOSIS — Z8249 Family history of ischemic heart disease and other diseases of the circulatory system: Secondary | ICD-10-CM

## 2023-07-02 DIAGNOSIS — F250 Schizoaffective disorder, bipolar type: Secondary | ICD-10-CM

## 2023-07-02 DIAGNOSIS — J4520 Mild intermittent asthma, uncomplicated: Secondary | ICD-10-CM | POA: Diagnosis present

## 2023-07-02 DIAGNOSIS — R042 Hemoptysis: Secondary | ICD-10-CM | POA: Diagnosis present

## 2023-07-02 DIAGNOSIS — S68114D Complete traumatic metacarpophalangeal amputation of right ring finger, subsequent encounter: Secondary | ICD-10-CM

## 2023-07-02 DIAGNOSIS — Z1152 Encounter for screening for COVID-19: Secondary | ICD-10-CM

## 2023-07-02 DIAGNOSIS — F17210 Nicotine dependence, cigarettes, uncomplicated: Secondary | ICD-10-CM | POA: Diagnosis present

## 2023-07-02 DIAGNOSIS — J811 Chronic pulmonary edema: Secondary | ICD-10-CM | POA: Diagnosis present

## 2023-07-02 LAB — RESP PANEL BY RT-PCR (RSV, FLU A&B, COVID)  RVPGX2
Influenza A by PCR: NEGATIVE
Influenza B by PCR: NEGATIVE
Resp Syncytial Virus by PCR: NEGATIVE
SARS Coronavirus 2 by RT PCR: NEGATIVE

## 2023-07-02 LAB — BASIC METABOLIC PANEL WITH GFR
Anion gap: 9 (ref 5–15)
BUN: 14 mg/dL (ref 6–20)
CO2: 22 mmol/L (ref 22–32)
Calcium: 8.8 mg/dL — ABNORMAL LOW (ref 8.9–10.3)
Chloride: 104 mmol/L (ref 98–111)
Creatinine, Ser: 1.14 mg/dL (ref 0.61–1.24)
GFR, Estimated: >60 mL/min (ref >60)
Glucose, Bld: 116 mg/dL — ABNORMAL HIGH (ref 70–99)
Potassium: 3.8 mmol/L (ref 3.5–5.1)
Sodium: 135 mmol/L (ref 135–145)

## 2023-07-02 LAB — D-DIMER, QUANTITATIVE: D-Dimer, Quant: 0.9 ug{FEU}/mL — ABNORMAL HIGH (ref 0.00–0.50)

## 2023-07-02 LAB — PROTIME-INR
INR: 1.1 (ref 0.8–1.2)
Prothrombin Time: 14.4 s (ref 11.4–15.2)

## 2023-07-02 LAB — CBC
HCT: 42.2 % (ref 39.0–52.0)
Hemoglobin: 13.3 g/dL (ref 13.0–17.0)
MCH: 21.5 pg — ABNORMAL LOW (ref 26.0–34.0)
MCHC: 31.5 g/dL (ref 30.0–36.0)
MCV: 68.1 fL — ABNORMAL LOW (ref 80.0–100.0)
Platelets: 219 10*3/uL (ref 150–400)
RBC: 6.2 MIL/uL — ABNORMAL HIGH (ref 4.22–5.81)
RDW: 17.5 % — ABNORMAL HIGH (ref 11.5–15.5)
WBC: 15.1 10*3/uL — ABNORMAL HIGH (ref 4.0–10.5)
nRBC: 0 % (ref 0.0–0.2)

## 2023-07-02 LAB — I-STAT VENOUS BLOOD GAS, ED
Acid-base deficit: 1 mmol/L (ref 0.0–2.0)
Bicarbonate: 22.6 mmol/L (ref 20.0–28.0)
Calcium, Ion: 1.1 mmol/L — ABNORMAL LOW (ref 1.15–1.40)
HCT: 40 % (ref 39.0–52.0)
Hemoglobin: 13.6 g/dL (ref 13.0–17.0)
O2 Saturation: 93 %
Potassium: 4 mmol/L (ref 3.5–5.1)
Sodium: 138 mmol/L (ref 135–145)
TCO2: 24 mmol/L (ref 22–32)
pCO2, Ven: 34.3 mmHg — ABNORMAL LOW (ref 44–60)
pH, Ven: 7.427 (ref 7.25–7.43)
pO2, Ven: 65 mmHg — ABNORMAL HIGH (ref 32–45)

## 2023-07-02 LAB — APTT: aPTT: 27 s (ref 24–36)

## 2023-07-02 LAB — BRAIN NATRIURETIC PEPTIDE: B Natriuretic Peptide: 32.5 pg/mL (ref 0.0–100.0)

## 2023-07-02 MED ORDER — HYDROMORPHONE HCL 1 MG/ML IJ SOLN: 0.5000 mg | INTRAMUSCULAR | Status: DC | PRN

## 2023-07-02 MED ORDER — FUROSEMIDE 10 MG/ML IJ SOLN
40.0000 mg | Freq: Once | INTRAMUSCULAR | Status: AC
  Administered 2023-07-02: 40 mg via INTRAVENOUS
  Filled 2023-07-02: qty 4

## 2023-07-02 MED ORDER — OXYCODONE HCL 5 MG PO TABS
5.0000 mg | ORAL_TABLET | ORAL | Status: DC | PRN
  Administered 2023-07-02 – 2023-07-03 (×2): 5 mg via ORAL
  Filled 2023-07-02 (×2): qty 1

## 2023-07-02 MED ORDER — ACETAMINOPHEN 325 MG PO TABS
650.0000 mg | ORAL_TABLET | Freq: Four times a day (QID) | ORAL | Status: DC | PRN
  Administered 2023-07-03: 650 mg via ORAL
  Filled 2023-07-02: qty 2

## 2023-07-02 MED ORDER — SODIUM CHLORIDE 0.9% FLUSH
3.0000 mL | Freq: Two times a day (BID) | INTRAVENOUS | Status: DC
  Administered 2023-07-02 – 2023-07-03 (×2): 3 mL via INTRAVENOUS

## 2023-07-02 MED ORDER — SENNOSIDES-DOCUSATE SODIUM 8.6-50 MG PO TABS: 1.0000 | ORAL_TABLET | Freq: Every evening | ORAL | Status: DC | PRN

## 2023-07-02 MED ORDER — ONDANSETRON HCL 4 MG/2ML IJ SOLN: 4.0000 mg | Freq: Four times a day (QID) | INTRAMUSCULAR | Status: DC | PRN

## 2023-07-02 MED ORDER — TRANEXAMIC ACID FOR INHALATION
500.0000 mg | Freq: Once | RESPIRATORY_TRACT | Status: AC
  Administered 2023-07-02: 500 mg via RESPIRATORY_TRACT
  Filled 2023-07-02: qty 10

## 2023-07-02 MED ORDER — ALBUTEROL SULFATE (2.5 MG/3ML) 0.083% IN NEBU: 2.5000 mg | INHALATION_SOLUTION | Freq: Four times a day (QID) | RESPIRATORY_TRACT | Status: DC | PRN
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
## 2023-07-02 NOTE — ED Triage Notes (Signed)
 Pt states he had surgery to right hand 2 days ago. Pt endorses SOB but does not know for how long due to being drowsy from surgery. Pts O2 89-91% RA. States pain when using incentive spirometer and blood comes up.

## 2023-07-02 NOTE — ED Notes (Signed)
 Oxycodone  5mg  po admin for c/o chest pain 10/10

## 2023-07-03 ENCOUNTER — Inpatient Hospital Stay (HOSPITAL_COMMUNITY): Payer: Self-pay

## 2023-07-03 LAB — BASIC METABOLIC PANEL WITH GFR
Anion gap: 9 (ref 5–15)
BUN: 14 mg/dL (ref 6–20)
CO2: 25 mmol/L (ref 22–32)
Calcium: 8.8 mg/dL — ABNORMAL LOW (ref 8.9–10.3)
Chloride: 102 mmol/L (ref 98–111)
Creatinine, Ser: 1.15 mg/dL (ref 0.61–1.24)
GFR, Estimated: >60 mL/min (ref >60)
Glucose, Bld: 100 mg/dL — ABNORMAL HIGH (ref 70–99)
Potassium: 3.5 mmol/L (ref 3.5–5.1)
Sodium: 136 mmol/L (ref 135–145)

## 2023-07-03 LAB — CBC
HCT: 38.9 % — ABNORMAL LOW (ref 39.0–52.0)
Hemoglobin: 12.4 g/dL — ABNORMAL LOW (ref 13.0–17.0)
MCH: 21.4 pg — ABNORMAL LOW (ref 26.0–34.0)
MCHC: 31.9 g/dL (ref 30.0–36.0)
MCV: 67.2 fL — ABNORMAL LOW (ref 80.0–100.0)
Platelets: 214 K/uL (ref 150–400)
RBC: 5.79 MIL/uL (ref 4.22–5.81)
RDW: 16.1 % — ABNORMAL HIGH (ref 11.5–15.5)
WBC: 16.6 K/uL — ABNORMAL HIGH (ref 4.0–10.5)
nRBC: 0 % (ref 0.0–0.2)

## 2023-07-03 LAB — MAGNESIUM: Magnesium: 2 mg/dL (ref 1.7–2.4)

## 2023-07-03 LAB — HIV ANTIBODY (ROUTINE TESTING W REFLEX): HIV Screen 4th Generation wRfx: NONREACTIVE

## 2023-07-03 MED ORDER — IOHEXOL 350 MG/ML SOLN
75.0000 mL | Freq: Once | INTRAVENOUS | Status: AC | PRN
  Administered 2023-07-03: 75 mL via INTRAVENOUS

## 2023-07-03 MED ORDER — DOXYCYCLINE HYCLATE 100 MG PO TABS: 100.0000 mg | ORAL_TABLET | Freq: Two times a day (BID) | ORAL | 0 refills | Status: AC

## 2023-07-03 MED ORDER — AMOXICILLIN-POT CLAVULANATE 875-125 MG PO TABS
1.0000 | ORAL_TABLET | Freq: Two times a day (BID) | ORAL | Status: DC
  Administered 2023-07-03: 1 via ORAL
  Filled 2023-07-03 (×2): qty 1

## 2023-07-03 MED ORDER — IPRATROPIUM-ALBUTEROL 0.5-2.5 (3) MG/3ML IN SOLN: 3.0000 mL | Freq: Four times a day (QID) | RESPIRATORY_TRACT | Status: DC

## 2023-07-03 MED ORDER — NICOTINE 14 MG/24HR TD PT24
14.0000 mg | MEDICATED_PATCH | Freq: Every day | TRANSDERMAL | Status: DC
  Administered 2023-07-03: 14 mg via TRANSDERMAL
  Filled 2023-07-03: qty 1

## 2023-07-03 MED ORDER — DM-GUAIFENESIN ER 30-600 MG PO TB12
1.0000 | ORAL_TABLET | Freq: Two times a day (BID) | ORAL | Status: DC
  Administered 2023-07-03: 1 via ORAL
  Filled 2023-07-03 (×2): qty 1

## 2023-07-03 MED ORDER — ORAL CARE MOUTH RINSE: 15.0000 mL | OROMUCOSAL | Status: DC | PRN

## 2023-07-03 MED ORDER — ALBUTEROL SULFATE HFA 108 (90 BASE) MCG/ACT IN AERS: 1.0000 | INHALATION_SPRAY | RESPIRATORY_TRACT | 1 refills | Status: AC | PRN

## 2023-07-03 MED ORDER — DM-GUAIFENESIN ER 30-600 MG PO TB12: 1.0000 | ORAL_TABLET | Freq: Two times a day (BID) | ORAL | 0 refills | Status: AC

## 2023-07-03 MED ORDER — NICOTINE 14 MG/24HR TD PT24: 14.0000 mg | MEDICATED_PATCH | Freq: Every day | TRANSDERMAL | 0 refills | Status: AC

## 2023-07-03 MED ORDER — GUAIFENESIN-DM 100-10 MG/5ML PO SYRP: 10.0000 mL | ORAL_SOLUTION | Freq: Four times a day (QID) | ORAL | Status: DC | PRN

## 2023-07-03 NOTE — Progress Notes (Signed)
 Patient ambulating in room after taking a shower and tells this nurse he feels like he can go home. Reports improvement in breathing and able to obtain on IS each time. Dr. Hilton Lucky made aware of patient's request.

## 2023-07-03 NOTE — Progress Notes (Signed)
 Patient choosing to leave AMA after discharge not advised by Dr. Hilton Lucky and this nurse. This nurse explained the risks of leaving against medical advice and verbalized understanding. IV removed and patient to leave the unit ambulatory after getting dressed.

## 2023-07-03 NOTE — Progress Notes (Signed)
 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  In the emergency room afebrile.  Oxygen saturation 80 to 90% on room air, tachypneic and mildly tachycardic.  Blood pressure stable.  Electrolytes adequate.  Hemoglobin stable.  Influenza COVID RSV negative.  WC count 15.1.  Chest x-ray with bilateral perihilar airspace disease.  Patient was given nebulizers, tranexamic acid and admitted to the hospital.  Subjective: Patient seen and examined.  Mother at the bedside.  Patient tells me that he feels much better than yesterday.  He is not having any more hemoptysis. Discussed CT scan findings with patient and family.  Currently on room air.    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  Already clinically stabilizing. Continue bronchodilator therapy, oral antibiotics with Augmentin  for 7 days, deep breathing exercises, incentive spirometry, chest physiotherapy. Mucolytic's. Antibiotics due to severity of symptoms. Supplemental oxygen to keep saturations more than 90%. Mobilize in the hallway.  Chronic intermittent asthma: No evidence of exacerbation.  Will need albuterol  inhaler on discharge.  Smoker: Counseled to quit.  Nicotine  patch provided.  Schizoaffective disorder: Untreated.  Reports stable.  Right finger injury: Status post debridement and closure.  Scheduled to follow-up with surgery on 4/30.   Clinically improving.  Can transfer to MedSurg bed.  Mobilize around.  Continue chest physical  therapy.   DVT prophylaxis: SCDs Start: 07/02/23 2045   Code Status: Full code Family Communication: Mother at bedside Disposition Plan: Status is: Inpatient Remains inpatient appropriate because: Significant symptoms, needs monitoring     Consultants:  None  Procedures:  None  Antimicrobials:  Augmentin  4/27--     Objective: Vitals:   07/03/23 0116 07/03/23 0117 07/03/23 0449 07/03/23 1116  BP:   117/76 115/75  Pulse: 91 95 80 97  Resp:   18 18  Temp:   97.8 F (36.6 C) 98.8 F (37.1 C)  TempSrc:   Oral   SpO2: 95% 96% 100% 95%  Weight:   79 kg   Height:        Intake/Output Summary (Last 24 hours) at 07/03/2023 1141 Last data filed at 07/03/2023 0900 Gross per 24 hour  Intake 120 ml  Output 850 ml  Net -730 ml   Filed Weights   07/02/23 2114 07/03/23 0449  Weight: 79 kg 79 kg    Examination:  General exam: Appears calm and comfortable  Respiratory system: Good bilateral air entry.  expiratory wheezes and bronchial sounds present.  On room air today. Cardiovascular system: S1 & S2 heard, RRR. No JVD, murmurs, rubs, gallops or clicks. No pedal edema. Gastrointestinal system: Abdomen is nondistended, soft and nontender. No organomegaly or masses felt. Normal bowel sounds heard. Central nervous system: Alert and oriented. No focal neurological deficits. Extremities: Symmetric 5 x 5 power. Right hand on dressing , intact     Data Reviewed: I have personally reviewed following labs and imaging studies  CBC: Recent Labs  Lab 07/01/23 0124 07/02/23 1841 07/02/23 1907 07/03/23 0309  WBC 10.1 15.1*  --  16.6*  HGB 13.2 13.3  13.6 12.4*  HCT 41.9 42.2 40.0 38.9*  MCV 67.5* 68.1*  --  67.2*  PLT 251 219  --  214   Basic Metabolic Panel: Recent Labs  Lab 07/01/23 0124 07/02/23 1841 07/02/23 1907 07/03/23 0309  NA 137 135 138 136  K 3.8 3.8 4.0 3.5  CL 104 104  --  102  CO2 20* 22  --  25  GLUCOSE 103* 116*  --  100*  BUN 24* 14  --  14   CREATININE 1.13 1.14  --  1.15  CALCIUM 9.1 8.8*  --  8.8*  MG  --   --   --  2.0   GFR: Estimated Creatinine Clearance: 91.2 mL/min (by C-G formula based on SCr of 1.15 mg/dL). Liver Function Tests: No results for input(s): "AST", "ALT", "ALKPHOS", "BILITOT", "PROT", "ALBUMIN" in the last 168 hours. No results for input(s): "LIPASE", "AMYLASE" in the last 168 hours. No results for input(s): "AMMONIA" in the last 168 hours. Coagulation Profile: Recent Labs  Lab 07/02/23 1855  INR 1.1   Cardiac Enzymes: No results for input(s): "CKTOTAL", "CKMB", "CKMBINDEX", "TROPONINI" in the last 168 hours. BNP (last 3 results) No results for input(s): "PROBNP" in the last 8760 hours. HbA1C: No results for input(s): "HGBA1C" in the last 72 hours. CBG: No results for input(s): "GLUCAP" in the last 168 hours. Lipid Profile: No results for input(s): "CHOL", "HDL", "LDLCALC", "TRIG", "CHOLHDL", "LDLDIRECT" in the last 72 hours. Thyroid Function Tests: No results for input(s): "TSH", "T4TOTAL", "FREET4", "T3FREE", "THYROIDAB" in the last 72 hours. Anemia Panel: No results for input(s): "VITAMINB12", "FOLATE", "FERRITIN", "TIBC", "IRON", "RETICCTPCT" in the last 72 hours. Sepsis Labs: No results for input(s): "PROCALCITON", "LATICACIDVEN" in the last 168 hours.  Recent Results (from the past 240 hours)  Resp panel by RT-PCR (RSV, Flu A&B, Covid) Anterior Nasal Swab     Status: None   Collection Time: 07/02/23  6:38 PM   Specimen: Anterior Nasal Swab  Result Value Ref Range Status   SARS Coronavirus 2 by RT PCR NEGATIVE NEGATIVE Final   Influenza A by PCR NEGATIVE NEGATIVE Final   Influenza B by PCR NEGATIVE NEGATIVE Final    Comment: (NOTE) The Xpert Xpress SARS-CoV-2/FLU/RSV plus assay is intended as an aid in the diagnosis of influenza from Nasopharyngeal swab specimens and should not be used as a sole basis for treatment. Nasal washings and aspirates are unacceptable for Xpert Xpress  SARS-CoV-2/FLU/RSV testing.  Fact Sheet for Patients: BloggerCourse.com  Fact Sheet for Healthcare Providers: SeriousBroker.it  This test is not yet approved or cleared by the [place]  FDA and has been authorized for detection and/or diagnosis of SARS-CoV-2 by FDA under an Emergency Use Authorization (EUA). This EUA will remain in effect (meaning this test can be used) for the duration of the COVID-19 declaration under Section 564(b)(1) of the Act, 21 U.S.C. section 360bbb-3(b)(1), unless the authorization is terminated or revoked.     Resp Syncytial Virus by PCR NEGATIVE NEGATIVE Final    Comment: (NOTE) Fact Sheet for Patients: BloggerCourse.com  Fact Sheet for Healthcare Providers: SeriousBroker.it  This test is not yet approved or cleared by the [place]  FDA and has been authorized for detection and/or diagnosis of SARS-CoV-2 by FDA under an Emergency Use Authorization (EUA). This EUA will remain in effect (meaning this test can be used) for the duration of the COVID-19 declaration under Section 564(b)(1) of the Act, 21 U.S.C. section 360bbb-3(b)(1), unless the authorization is terminated or revoked.  Performed at [facility], [address], [place], [place] [address]          Radiology Studies: CT Angio Chest Pulmonary Embolism (PE) W or WO Contrast Result Date: 07/03/2023 CLINICAL DATA:  Evaluate for pulmonary embolism. Shortness of breath. Status post amputation of right fifth digit. Now with hemoptysis and shortness of breath. Rule out PE. EXAM: CT ANGIOGRAPHY CHEST WITH CONTRAST TECHNIQUE: Multidetector CT imaging of the chest was performed using the standard protocol during bolus administration of intravenous contrast. Multiplanar CT image reconstructions and MIPs were obtained to evaluate the vascular anatomy. RADIATION DOSE REDUCTION: This exam  was performed according to the departmental dose-optimization program which includes automated exposure control, adjustment of the mA and/or kV according to patient size and/or use of iterative reconstruction technique. CONTRAST:  75mL OMNIPAQUE IOHEXOL 350 MG/ML SOLN COMPARISON:  None Available. FINDINGS: Cardiovascular: Satisfactory opacification of the pulmonary arteries to the segmental level. No evidence of pulmonary embolism. Normal heart size. No pericardial effusion. Mediastinum/Nodes: There are prominent bilateral hilar and mediastinal lymph nodes. Index right hilar node measures 1.2 cm, image 61/5. Subcarinal lymph node measures 1 cm, image 61/5. The trachea appears patent and midline. Normal appearance of the esophagus and thyroid gland. Lungs/Pleura: The central airways are patent. Paraseptal emphysema is identified along with diffuse bronchial wall thickening. No pleural effusion or pneumothorax. Severe bilateral ground-glass opacities are identified with sparing of the peripheral margins of the lungs. No consolidative change identified. Upper Abdomen: No acute abnormality. Musculoskeletal: No chest wall abnormality. No acute or significant osseous findings. Review of the MIP images confirms the above findings. IMPRESSION: 1. No evidence for acute pulmonary embolus. 2. Severe bilateral ground-glass opacities with sparing of the peripheral margins of the lungs. Differential considerations include diffuse alveolar hemorrhage, acute pulmonary edema/ARDS, or atypical inflammation or infection. 3. Prominent bilateral hilar and mediastinal lymph nodes are identified, likely reactive. 4.  Emphysema (ICD10-J43.9). Electronically Signed   By: Kimberley Penman M.D.   On: 07/03/2023 08:52   DG Chest 2 View Result Date: 07/02/2023 CLINICAL DATA:  Short of breath, recent right hand surgery EXAM: CHEST - 2 VIEW COMPARISON:  07/01/2023 FINDINGS: Frontal and lateral views of the chest demonstrate a stable cardiac  silhouette. Progressive bilateral perihilar airspace disease with significant worsening since prior exam. No effusion or pneumothorax. No acute bony abnormalities. IMPRESSION: 1. Worsening bilateral perihilar airspace disease, which may reflect noncardiogenic edema, inflammation, or infection. Electronically Signed   By: Bobbye Burrow M.D.   On: 07/02/2023 19:59        Scheduled Meds:  amoxicillin -clavulanate  1 tablet Oral Q12H   dextromethorphan-guaiFENesin   1 tablet Oral BID   ipratropium-albuterol   3 mL Nebulization Q6H   nicotine   14 mg Transdermal Daily   sodium chloride  flush  3 mL Intravenous Q12H   Continuous Infusions:   LOS: 1 day    Time spent: 50 minutes    Vada Garibaldi, MD [facility]

## 2023-07-03 NOTE — Progress Notes (Signed)
 Patient has agreed to stay at this time. Transportation at bedside to take him for CT.

## 2023-07-03 NOTE — Progress Notes (Signed)
 This RN received report from [employer] in patients room. Patient stated that he wants to leave and that he might leave to go to another hospital.   Patient called RN back at this time and stated that he wants to leave and that he is calling his mother at this time.  AMA form given to patient and stated " I am not signing that right now, I want to talk to the doctor.  I am calling my mother. I am leaving to another hospital. Tell the doctor to come, and leave the paper and leave me alone" in an elevated tone.  Message sent to Dr. Hilton Lucky.

## 2023-07-03 NOTE — Plan of Care (Signed)
  Problem: Education: Goal: Knowledge of General Education information will improve Description: Including pain rating scale, medication(s)/side effects and non-pharmacologic comfort measures Outcome: Progressing   Problem: Health Behavior/Discharge Planning: Goal: Ability to manage health-related needs will improve Outcome: Progressing   Problem: Clinical Measurements: Goal: Ability to maintain clinical measurements within normal limits will improve Outcome: Progressing Goal: Will remain free from infection Outcome: Progressing Goal: Diagnostic test results will improve Outcome: Progressing Goal: Respiratory complications will improve Outcome: Progressing   Problem: Nutrition: Goal: Adequate nutrition will be maintained Outcome: Progressing   Problem: Coping: Goal: Level of anxiety will decrease Outcome: Progressing   Problem: Elimination: Goal: Will not experience complications related to bowel motility Outcome: Progressing

## 2023-07-03 NOTE — Discharge Summary (Signed)
 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 In the emergency room afebrile. Oxygen saturation 80 to 90% on room air, tachypneic and mildly tachycardic. Blood pressure stable. Electrolytes adequate. Hemoglobin stable. Influenza COVID RSV negative. WC count 15.1. Chest x-ray with bilateral perihilar airspace disease. Patient was given nebulizers, tranexamic acid and admitted to the hospital.   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  Already clinically stabilizing. CT angiogram negative for pulmonary embolism, however it showed extensive bilateral alveolar hemorrhage or ARDS.  Patient today with clinical improvement.  He was started on bronchodilator therapy, chest physiotherapy and oral antibiotics.  Patient was on room air. Advised to stay in the hospital for monitoring given severity of symptoms on initial presentation, however patient stated he feels good and wanted to go home today. Patient was discharged AGAINST MEDICAL ADVICE. He was still provided with instructions, counseling, antibiotics with doxycycline  100 mg twice daily for 7 days, Mucinex .  Advised to continue incentive spirometry.  Advised  to come back to emergency room for excess blood in his sputum. Strong counseling to quit smoking.  Nicotine  patch prescribed. Patient wanted prescription for albuterol , that was also prescribed. He already has scheduled follow-up with hand surgery regarding his finger surgery.  Somewhat clinically improving.  He was still needing some monitoring in the hospital, left AMA.  Discharge Diagnoses:  Principal Problem:   Acute respiratory failure with hypoxia (HCC) Active Problems:   ASTHMA, PERSISTENT   Schizoaffective disorder (HCC)   Blood-tinged sputum    Discharge Instructions   Allergies as of 07/03/2023       Reactions   Ceclor [cefaclor] Hives, Itching, Rash        Medication List     TAKE these medications    albuterol  108 (90 Base) MCG/ACT inhaler Commonly known as: VENTOLIN  HFA Inhale 1-2 puffs into the lungs every 4 (four) hours as needed for wheezing or shortness of breath.   dextromethorphan-guaiFENesin  30-600 MG 12hr tablet Commonly known as: MUCINEX  DM Take 1 tablet by mouth 2 (two) times daily for 7 days.   doxycycline  100 MG tablet Commonly known as: VIBRA -TABS Take 1 tablet (100 mg total) by mouth 2 (two) times daily for 7 days.   nicotine  14 mg/24hr patch Commonly known as: NICODERM CQ  - dosed in mg/24 hours Place 1 patch (14 mg total) onto the skin daily. Start taking on: July 04, 2023   oxyCODONE  5 MG immediate release tablet Commonly known as: Roxicodone  Take 1 tablet (5 mg total) by mouth every 6 (six) hours as needed for severe pain (pain score 7-10).        Allergies  Allergen Reactions   Ceclor [Cefaclor] Hives, Itching and Rash    Consultations: None   Procedures/Studies: CT Angio Chest Pulmonary Embolism (PE) W or WO Contrast Result  Date: 07/03/2023 CLINICAL DATA:  Evaluate for pulmonary embolism. Shortness of breath. Status post amputation of right fifth digit. Now with hemoptysis and shortness of breath. Rule out PE. EXAM:  CT ANGIOGRAPHY CHEST WITH CONTRAST TECHNIQUE: Multidetector CT imaging of the chest was performed using the standard protocol during bolus administration of intravenous contrast. Multiplanar CT image reconstructions and MIPs were obtained to evaluate the vascular anatomy. RADIATION DOSE REDUCTION: This exam was performed according to the departmental dose-optimization program which includes automated exposure control, adjustment of the mA and/or kV according to patient size and/or use of iterative reconstruction technique. CONTRAST:  75mL OMNIPAQUE IOHEXOL 350 MG/ML SOLN COMPARISON:  None Available. FINDINGS: Cardiovascular: Satisfactory opacification of the pulmonary arteries to the segmental level. No evidence of pulmonary embolism. Normal heart size. No pericardial effusion. Mediastinum/Nodes: There are prominent bilateral hilar and mediastinal lymph nodes. Index right hilar node measures 1.2 cm, image 61/5. Subcarinal lymph node measures 1 cm, image 61/5. The trachea appears patent and midline. Normal appearance of the esophagus and thyroid gland. Lungs/Pleura: The central airways are patent. Paraseptal emphysema is identified along with diffuse bronchial wall thickening. No pleural effusion or pneumothorax. Severe bilateral ground-glass opacities are identified with sparing of the peripheral margins of the lungs. No consolidative change identified. Upper Abdomen: No acute abnormality. Musculoskeletal: No chest wall abnormality. No acute or significant osseous findings. Review of the MIP images confirms the above findings. IMPRESSION: 1. No evidence for acute pulmonary embolus. 2. Severe bilateral ground-glass opacities with sparing of the peripheral margins of the lungs. Differential considerations include diffuse alveolar hemorrhage, acute pulmonary edema/ARDS, or atypical inflammation or infection. 3. Prominent bilateral hilar and mediastinal lymph nodes are identified, likely reactive. 4.  Emphysema  (ICD10-J43.9). Electronically Signed   By: Kimberley Penman M.D.   On: 07/03/2023 08:52   DG Chest 2 View Result Date: 07/02/2023 CLINICAL DATA:  Short of breath, recent right hand surgery EXAM: CHEST - 2 VIEW COMPARISON:  07/01/2023 FINDINGS: Frontal and lateral views of the chest demonstrate a stable cardiac silhouette. Progressive bilateral perihilar airspace disease with significant worsening since prior exam. No effusion or pneumothorax. No acute bony abnormalities. IMPRESSION: 1. Worsening bilateral perihilar airspace disease, which may reflect noncardiogenic edema, inflammation, or infection. Electronically Signed   By: Bobbye Burrow M.D.   On: 07/02/2023 19:59   DG CHEST PORT 1 VIEW Result Date: 07/01/2023 CLINICAL DATA:  Wheezing EXAM: PORTABLE CHEST 1 VIEW COMPARISON:  05/04/2019 FINDINGS: Symmetric patchy airspace density, somewhat greater in the perihilar and upper lungs. No edema, effusion, or pneumothorax. Normal heart size and mediastinal contours. Artifact from EKG leads. IMPRESSION: Symmetric pulmonary opacit could be noncardiogenic edema or aspiration. Electronically Signed   By: Ronnette Coke M.D.   On: 07/01/2023 07:29   DG Finger Little Right Result Date: 07/01/2023 CLINICAL DATA:  : near complete amputation / cut his hand on pocket knife while messing with his tire EXAM: RIGHT LITTLE FINGER 2+V COMPARISON:  X-ray right hand 09/19/2022 FINDINGS: Limited evaluation due to overlying bandage. There is no evidence of fracture or dislocation. Question slight subluxation of the fifth digit proximal interphalangeal joint. There is no evidence of arthropathy. Subcutaneus soft tissue edema and emphysema of the right fifth digit at the level of the proximal interphalangeal joint. No retained radiopaque foreign body. Soft tissues are unremarkable. IMPRESSION: 1. No acute displaced fracture or dislocation. Question slight subluxation of the fifth digit proximal interphalangeal joint. Limited  evaluation due to overlapping osseous structures and overlying soft tissues.  2. Subcutaneus soft tissue edema and emphysema of the right fifth digit at the level of the proximal interphalangeal joint. No retained radiopaque foreign body. Limited evaluation due to overlapping osseous structures and overlying soft tissues. Electronically Signed   By: Morgane  Naveau M.D.   On: 07/01/2023 00:33   (Echo, Carotid, EGD, Colonoscopy, ERCP)    Subjective: Seen in morning rounds with family as per my previous note.  Later in the afternoon he decided to leave.   Discharge Exam: Vitals:   07/03/23 0449 07/03/23 1116  BP: 117/76 115/75  Pulse: 80 97  Resp: 18 18  Temp: 97.8 F (36.6 C) 98.8 F (37.1 C)  SpO2: 100% 95%   Vitals:   07/03/23 0116 07/03/23 0117 07/03/23 0449 07/03/23 1116  BP:   117/76 115/75  Pulse: 91 95 80 97  Resp:   18 18  Temp:   97.8 F (36.6 C) 98.8 F (37.1 C)  TempSrc:   Oral   SpO2: 95% 96% 100% 95%  Weight:   79 kg   Height:          The results of significant diagnostics from this hospitalization (including imaging, microbiology, ancillary and laboratory) are listed below for reference.     Microbiology: Recent Results (from the past 240 hours)  Resp panel by RT-PCR (RSV, Flu A&B, Covid) Anterior Nasal Swab     Status: None   Collection Time: 07/02/23  6:38 PM   Specimen: Anterior Nasal Swab  Result Value Ref Range Status   SARS Coronavirus 2 by RT PCR NEGATIVE NEGATIVE Final   Influenza A by PCR NEGATIVE NEGATIVE Final   Influenza B by PCR NEGATIVE NEGATIVE Final    Comment: (NOTE) The Xpert Xpress SARS-CoV-2/FLU/RSV plus assay is intended as an aid in the diagnosis of influenza from Nasopharyngeal swab specimens and should not be used as a sole basis for treatment. Nasal washings and aspirates are unacceptable for Xpert Xpress SARS-CoV-2/FLU/RSV testing.  Fact Sheet for Patients: BloggerCourse.com  Fact Sheet for  Healthcare Providers: SeriousBroker.it  This test is not yet approved or cleared by the [place]  FDA and has been authorized for detection and/or diagnosis of SARS-CoV-2 by FDA under an Emergency Use Authorization (EUA). This EUA will remain in effect (meaning this test can be used) for the duration of the COVID-19 declaration under Section 564(b)(1) of the Act, 21 U.S.C. section 360bbb-3(b)(1), unless the authorization is terminated or revoked.     Resp Syncytial Virus by PCR NEGATIVE NEGATIVE Final    Comment: (NOTE) Fact Sheet for Patients: BloggerCourse.com  Fact Sheet for Healthcare Providers: SeriousBroker.it  This test is not yet approved or cleared by the [place]  FDA and has been authorized for detection and/or diagnosis of SARS-CoV-2 by FDA under an Emergency Use Authorization (EUA). This EUA will remain in effect (meaning this test can be used) for the duration of the COVID-19 declaration under Section 564(b)(1) of the Act, 21 U.S.C. section 360bbb-3(b)(1), unless the authorization is terminated or revoked.  Performed at [facility], [address], [place], [place] [address]      Labs: BNP (last 3 results) Recent Labs    07/02/23 1855  BNP 32.5   Basic Metabolic Panel: Recent Labs  Lab 07/01/23 0124 07/02/23 1841 07/02/23 1907 07/03/23 0309  NA 137 135 138 136  K 3.8 3.8 4.0 3.5  CL 104 104  --  102  CO2 20* 22  --  25  GLUCOSE 103* 116*  --  100*  BUN 24* 14  --  14  CREATININE 1.13 1.14  --  1.15  CALCIUM 9.1 8.8*  --  8.8*  MG  --   --   --  2.0   Liver Function Tests: No results for input(s): "AST", "ALT", "ALKPHOS", "BILITOT", "PROT", "ALBUMIN" in the last 168 hours. No results for input(s): "LIPASE", "AMYLASE" in the last 168 hours. No results for input(s): "AMMONIA" in the last 168 hours. CBC: Recent Labs  Lab 07/01/23 0124 07/02/23 1841  07/02/23 1907 07/03/23 0309  WBC 10.1 15.1*  --  16.6*  HGB 13.2 13.3 13.6 12.4*  HCT 41.9 42.2 40.0 38.9*  MCV 67.5* 68.1*  --  67.2*  PLT 251 219  --  214   Cardiac Enzymes: No results for input(s): "CKTOTAL", "CKMB", "CKMBINDEX", "TROPONINI" in the last 168 hours. BNP: Invalid input(s): "POCBNP" CBG: No results for input(s): "GLUCAP" in the last 168 hours. D-Dimer Recent Labs    07/02/23 1855  DDIMER 0.90*   Hgb A1c No results for input(s): "HGBA1C" in the last 72 hours. Lipid Profile No results for input(s): "CHOL", "HDL", "LDLCALC", "TRIG", "CHOLHDL", "LDLDIRECT" in the last 72 hours. Thyroid function studies No results for input(s): "TSH", "T4TOTAL", "T3FREE", "THYROIDAB" in the last 72 hours.  Invalid input(s): "FREET3" Anemia work up No results for input(s): "VITAMINB12", "FOLATE", "FERRITIN", "TIBC", "IRON", "RETICCTPCT" in the last 72 hours. Urinalysis    Component Value Date/Time   COLORURINE YELLOW 05/15/2020 0700   APPEARANCEUR HAZY (A) 05/15/2020 0700   LABSPEC >1.030 (H) 05/15/2020 0700   PHURINE 6.0 05/15/2020 0700   GLUCOSEU NEGATIVE 05/15/2020 0700   HGBUR NEGATIVE 05/15/2020 0700   BILIRUBINUR SMALL (A) 05/15/2020 0700   KETONESUR 15 (A) 05/15/2020 0700   PROTEINUR NEGATIVE 05/15/2020 0700   NITRITE NEGATIVE 05/15/2020 0700   LEUKOCYTESUR NEGATIVE 05/15/2020 0700   Sepsis Labs Recent Labs  Lab 07/01/23 0124 07/02/23 1841 07/03/23 0309  WBC 10.1 15.1* 16.6*   Microbiology Recent Results (from the past 240 hours)  Resp panel by RT-PCR (RSV, Flu A&B, Covid) Anterior Nasal Swab     Status: None   Collection Time: 07/02/23  6:38 PM   Specimen: Anterior Nasal Swab  Result Value Ref Range Status   SARS Coronavirus 2 by RT PCR NEGATIVE NEGATIVE Final   Influenza A by PCR NEGATIVE NEGATIVE Final   Influenza B by PCR NEGATIVE NEGATIVE Final    Comment: (NOTE) The Xpert Xpress SARS-CoV-2/FLU/RSV plus assay is intended as an aid in the diagnosis  of influenza from Nasopharyngeal swab specimens and should not be used as a sole basis for treatment. Nasal washings and aspirates are unacceptable for Xpert Xpress SARS-CoV-2/FLU/RSV testing.  Fact Sheet for Patients: BloggerCourse.com  Fact Sheet for Healthcare Providers: SeriousBroker.it  This test is not yet approved or cleared by the [place]  FDA and has been authorized for detection and/or diagnosis of SARS-CoV-2 by FDA under an Emergency Use Authorization (EUA). This EUA will remain in effect (meaning this test can be used) for the duration of the COVID-19 declaration under Section 564(b)(1) of the Act, 21 U.S.C. section 360bbb-3(b)(1), unless the authorization is terminated or revoked.     Resp Syncytial Virus by PCR NEGATIVE NEGATIVE Final    Comment: (NOTE) Fact Sheet for Patients: BloggerCourse.com  Fact Sheet for Healthcare Providers: SeriousBroker.it  This test is not yet approved or cleared by the [place]  FDA and has been authorized for detection and/or diagnosis of SARS-CoV-2 by FDA under an Emergency  Use Authorization (EUA). This EUA will remain in effect (meaning this test can be used) for the duration of the COVID-19 declaration under Section 564(b)(1) of the Act, 21 U.S.C. section 360bbb-3(b)(1), unless the authorization is terminated or revoked.  Performed at [facility], [address], [place], [place] [address]      Time coordinating discharge:  0 minutes  SIGNED:   Vada Garibaldi, MD  [facility] 07/03/2023, 12:59 PM

## 2023-10-01 ENCOUNTER — Ambulatory Visit (HOSPITAL_COMMUNITY): Payer: Self-pay

## 2023-11-25 ENCOUNTER — Ambulatory Visit: Payer: Self-pay

## 2023-11-25 ENCOUNTER — Ambulatory Visit (HOSPITAL_COMMUNITY)
Admission: EM | Admit: 2023-11-25 | Discharge: 2023-11-25 | Disposition: A | Discharge status: Home / Self Care | Payer: MEDICAID | Attending: Family Medicine | Admitting: Family Medicine

## 2023-11-25 ENCOUNTER — Encounter (HOSPITAL_COMMUNITY): Payer: Self-pay

## 2023-11-25 ENCOUNTER — Other Ambulatory Visit: Payer: Self-pay | Admitting: Medical Genetics

## 2023-11-25 DIAGNOSIS — Z113 Encounter for screening for infections with a predominantly sexual mode of transmission: Secondary | ICD-10-CM | POA: Diagnosis present

## 2023-11-25 LAB — HIV ANTIBODY (ROUTINE TESTING W REFLEX): HIV Screen 4th Generation wRfx: NONREACTIVE

## 2023-11-25 NOTE — Discharge Instructions (Signed)
 You were seen today for STD testing.  Your swab and blood work will be resulted over the weekend and you can see these results on mychart. If anything is positive our nurse will call to notify you for treatment.
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
## 2023-11-25 NOTE — ED Triage Notes (Signed)
 Patient presents to office sti-testing. Patient denies any symptoms.

## 2023-11-26 LAB — RPR: RPR Ser Ql: NONREACTIVE

## 2023-11-28 ENCOUNTER — Emergency Department (HOSPITAL_COMMUNITY)
Admission: EM | Admit: 2023-11-28 | Discharge: 2023-11-28 | Discharge status: Against Medical Advice | Payer: MEDICAID | Attending: Emergency Medicine | Admitting: Emergency Medicine

## 2023-11-28 ENCOUNTER — Other Ambulatory Visit: Payer: Self-pay

## 2023-11-28 ENCOUNTER — Encounter (HOSPITAL_COMMUNITY): Payer: Self-pay

## 2023-11-28 DIAGNOSIS — Z5321 Procedure and treatment not carried out due to patient leaving prior to being seen by health care provider: Secondary | ICD-10-CM | POA: Diagnosis not present

## 2023-11-28 DIAGNOSIS — R109 Unspecified abdominal pain: Secondary | ICD-10-CM | POA: Insufficient documentation

## 2023-11-28 DIAGNOSIS — Y92410 Unspecified street and highway as the place of occurrence of the external cause: Secondary | ICD-10-CM | POA: Insufficient documentation

## 2023-11-28 LAB — COMPREHENSIVE METABOLIC PANEL WITH GFR
ALT: 18 U/L (ref 0–44)
AST: 23 U/L (ref 15–41)
Albumin: 3.6 g/dL (ref 3.5–5.0)
Alkaline Phosphatase: 81 U/L (ref 38–126)
Anion gap: 10 (ref 5–15)
BUN: 14 mg/dL (ref 6–20)
CO2: 24 mmol/L (ref 22–32)
Calcium: 8.9 mg/dL (ref 8.9–10.3)
Chloride: 104 mmol/L (ref 98–111)
Creatinine, Ser: 1.1 mg/dL (ref 0.61–1.24)
GFR, Estimated: >60 mL/min (ref >60)
Glucose, Bld: 94 mg/dL (ref 70–99)
Potassium: 3.7 mmol/L (ref 3.5–5.1)
Sodium: 138 mmol/L (ref 135–145)
Total Bilirubin: 0.7 mg/dL (ref 0.0–1.2)
Total Protein: 7.2 g/dL (ref 6.5–8.1)

## 2023-11-28 LAB — CBC
HCT: 44.3 % (ref 39.0–52.0)
Hemoglobin: 13.9 g/dL (ref 13.0–17.0)
MCH: 21.4 pg — ABNORMAL LOW (ref 26.0–34.0)
MCHC: 31.4 g/dL (ref 30.0–36.0)
MCV: 68 fL — ABNORMAL LOW (ref 80.0–100.0)
Platelets: 281 K/uL (ref 150–400)
RBC: 6.51 MIL/uL — ABNORMAL HIGH (ref 4.22–5.81)
RDW: 17.5 % — ABNORMAL HIGH (ref 11.5–15.5)
WBC: 9.4 K/uL (ref 4.0–10.5)
nRBC: 0 % (ref 0.0–0.2)
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
## 2023-11-28 NOTE — ED Notes (Signed)
 Pt stated he is leaving and going to urgent care. He cannot wait for a room to be seen by a dr
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
## 2023-11-29 ENCOUNTER — Ambulatory Visit (HOSPITAL_COMMUNITY): Payer: MEDICAID

## 2023-11-29 ENCOUNTER — Ambulatory Visit: Payer: Self-pay

## 2023-11-29 LAB — CYTOLOGY, (ORAL, ANAL, URETHRAL) ANCILLARY ONLY
Chlamydia: NEGATIVE
Comment: NEGATIVE
Comment: NEGATIVE
Comment: NORMAL
Neisseria Gonorrhea: NEGATIVE
Trichomonas: NEGATIVE

## 2024-03-15 ENCOUNTER — Ambulatory Visit (HOSPITAL_COMMUNITY): Payer: MEDICAID
